# Patient Record
Sex: Male | Born: 1980
Health system: Southern US, Community
[De-identification: ages and names within clinical notes are randomized; demographics above are authoritative.]

## PROBLEM LIST (undated history)

## (undated) DIAGNOSIS — B20 Human immunodeficiency virus [HIV] disease: Secondary | ICD-10-CM

## (undated) DIAGNOSIS — Z21 Asymptomatic human immunodeficiency virus [HIV] infection status: Secondary | ICD-10-CM

## (undated) DIAGNOSIS — N189 Chronic kidney disease, unspecified: Secondary | ICD-10-CM

## (undated) DIAGNOSIS — R85612 Low grade squamous intraepithelial lesion on cytologic smear of anus (LGSIL): Secondary | ICD-10-CM

## (undated) DIAGNOSIS — E109 Type 1 diabetes mellitus without complications: Secondary | ICD-10-CM

## (undated) DIAGNOSIS — E113493 Type 2 diabetes mellitus with severe nonproliferative diabetic retinopathy without macular edema, bilateral: Secondary | ICD-10-CM

## (undated) DIAGNOSIS — D631 Anemia in chronic kidney disease: Secondary | ICD-10-CM

## (undated) DIAGNOSIS — E119 Type 2 diabetes mellitus without complications: Secondary | ICD-10-CM

## (undated) DIAGNOSIS — J189 Pneumonia, unspecified organism: Secondary | ICD-10-CM

## (undated) DIAGNOSIS — D849 Immunodeficiency, unspecified: Secondary | ICD-10-CM

## (undated) DIAGNOSIS — Z006 Encounter for examination for normal comparison and control in clinical research program: Secondary | ICD-10-CM

## (undated) DIAGNOSIS — B182 Chronic viral hepatitis C: Secondary | ICD-10-CM

## (undated) DIAGNOSIS — I10 Essential (primary) hypertension: Secondary | ICD-10-CM

## (undated) DIAGNOSIS — D649 Anemia, unspecified: Secondary | ICD-10-CM

## (undated) HISTORY — PX: WISDOM TOOTH EXTRACTION: SHX21

## (undated) HISTORY — PX: NO PAST SURGERIES: SHX2092

## (undated) HISTORY — DX: Asymptomatic human immunodeficiency virus (hiv) infection status: Z21

## (undated) HISTORY — DX: Chronic kidney disease, unspecified: N18.9

## (undated) HISTORY — DX: Anemia in chronic kidney disease: D63.1

## (undated) HISTORY — DX: Essential (primary) hypertension: I10

## (undated) HISTORY — DX: Anemia, unspecified: D64.9

## (undated) HISTORY — DX: Type 2 diabetes mellitus without complications: E11.9

## (undated) HISTORY — DX: Human immunodeficiency virus (HIV) disease: B20

---

## 2015-05-19 DIAGNOSIS — J189 Pneumonia, unspecified organism: Secondary | ICD-10-CM

## 2015-05-19 HISTORY — DX: Pneumonia, unspecified organism: J18.9

## 2016-03-12 ENCOUNTER — Telehealth: Payer: Self-pay

## 2016-03-12 NOTE — Telephone Encounter (Signed)
Medical records received. Patient scheduled for office visit. He may need medication refill prior to visit. He is insured and medication can be call to pharmacy for a 30 day supply.   Laverle Patter, RN

## 2016-03-24 ENCOUNTER — Other Ambulatory Visit: Payer: Self-pay

## 2016-03-24 DIAGNOSIS — B20 Human immunodeficiency virus [HIV] disease: Secondary | ICD-10-CM

## 2016-03-24 MED ORDER — ABACAVIR-DOLUTEGRAVIR-LAMIVUD 600-50-300 MG PO TABS: 1.0000 | ORAL_TABLET | Freq: Every day | ORAL | 0 refills | Status: DC

## 2016-03-24 NOTE — Telephone Encounter (Signed)
Patient is transferring care and will be out of medication soon.  30 day supply called to pharmacy, patient has appointment scheduled on 04-02-16 with Dr Linus Salmons.   Laverle Patter, RN

## 2016-03-27 ENCOUNTER — Telehealth: Payer: Self-pay

## 2016-03-27 ENCOUNTER — Other Ambulatory Visit: Payer: Self-pay

## 2016-03-27 DIAGNOSIS — B20 Human immunodeficiency virus [HIV] disease: Secondary | ICD-10-CM

## 2016-03-27 MED ORDER — ABACAVIR-DOLUTEGRAVIR-LAMIVUD 600-50-300 MG PO TABS: 1.0000 | ORAL_TABLET | Freq: Every day | ORAL | 0 refills | Status: DC

## 2016-03-27 NOTE — Telephone Encounter (Signed)
Please send medication to pharmacy.  Sent.

## 2016-03-27 NOTE — Telephone Encounter (Signed)
Patient called pharmacy. Medication was not there. I will resend meds.   Kassidy Frankson K Mathis Cashman,RN

## 2016-03-31 ENCOUNTER — Other Ambulatory Visit: Payer: Self-pay | Admitting: Pharmacist

## 2016-03-31 ENCOUNTER — Other Ambulatory Visit: Payer: Self-pay

## 2016-03-31 DIAGNOSIS — B20 Human immunodeficiency virus [HIV] disease: Secondary | ICD-10-CM

## 2016-03-31 MED ORDER — ABACAVIR-DOLUTEGRAVIR-LAMIVUD 600-50-300 MG PO TABS: 1.0000 | ORAL_TABLET | Freq: Every day | ORAL | 0 refills | Status: DC

## 2016-03-31 NOTE — Progress Notes (Signed)
Patient arrived as a walk in stating he is new to the area and is out of his Triumeq. He has an appointment with Dr. Linus Salmons on 11/16 but wanted to get his Triumeq refilled so that he did not miss any doses. [facility] so he needed them to sent to [facility] instead. I sent the prescription, called the pharmacy to confirm their receipt of the prescription and that the medication was in stock. Patient was informed of the plan and given a copay card. He will pick up his Triumeq today and follow up with Dr. Linus Salmons on Thursday.  Dimitri Ped, PharmD. PGY-2 Infectious Diseases Pharmacy Resident Pager: [phone] 03/31/2016, 3:33 PM

## 2016-04-02 ENCOUNTER — Ambulatory Visit (INDEPENDENT_AMBULATORY_CARE_PROVIDER_SITE_OTHER): Payer: Self-pay | Admitting: Internal Medicine

## 2016-04-02 ENCOUNTER — Encounter: Payer: Self-pay | Admitting: Internal Medicine

## 2016-04-02 DIAGNOSIS — Z113 Encounter for screening for infections with a predominantly sexual mode of transmission: Secondary | ICD-10-CM

## 2016-04-02 DIAGNOSIS — Z79899 Other long term (current) drug therapy: Secondary | ICD-10-CM

## 2016-04-02 DIAGNOSIS — B20 Human immunodeficiency virus [HIV] disease: Secondary | ICD-10-CM | POA: Insufficient documentation

## 2016-04-02 DIAGNOSIS — Z23 Encounter for immunization: Secondary | ICD-10-CM

## 2016-04-02 DIAGNOSIS — Z139 Encounter for screening, unspecified: Secondary | ICD-10-CM

## 2016-04-02 LAB — LIPID PANEL
Cholesterol: 272 mg/dL — ABNORMAL HIGH (ref <200)
HDL: 64 mg/dL (ref >40)
LDL Cholesterol: 184 mg/dL — ABNORMAL HIGH (ref <100)
Total CHOL/HDL Ratio: 4.3 Ratio (ref <5.0)
Triglycerides: 119 mg/dL (ref <150)
VLDL: 24 mg/dL (ref <30)

## 2016-04-02 LAB — COMPLETE METABOLIC PANEL WITH GFR
ALT: 10 U/L (ref 9–46)
AST: 15 U/L (ref 10–40)
Albumin: 3.7 g/dL (ref 3.6–5.1)
Alkaline Phosphatase: 62 U/L (ref 40–115)
BILIRUBIN TOTAL: 0.6 mg/dL (ref 0.2–1.2)
BUN: 13 mg/dL (ref 7–25)
CALCIUM: 9.3 mg/dL (ref 8.6–10.3)
CO2: 27 mmol/L (ref 20–31)
CREATININE: 1.93 mg/dL — AB (ref 0.60–1.35)
Chloride: 105 mmol/L (ref 98–110)
GFR, EST AFRICAN AMERICAN: 51 mL/min — AB (ref >=60)
GFR, Est Non African American: 44 mL/min — ABNORMAL LOW (ref >=60)
Glucose, Bld: 59 mg/dL — ABNORMAL LOW (ref 65–99)
Potassium: 3.8 mmol/L (ref 3.5–5.3)
Sodium: 139 mmol/L (ref 135–146)
TOTAL PROTEIN: 6.6 g/dL (ref 6.1–8.1)

## 2016-04-02 LAB — CBC WITH DIFFERENTIAL/PLATELET
BASOS PCT: 0 %
Basophils Absolute: 0 cells/uL (ref 0–200)
EOS ABS: 116 {cells}/uL (ref 15–500)
Eosinophils Relative: 2 %
HCT: 35.7 % — ABNORMAL LOW (ref 38.5–50.0)
Hemoglobin: 12.1 g/dL — ABNORMAL LOW (ref 13.2–17.1)
LYMPHS PCT: 64 %
Lymphs Abs: 3712 cells/uL (ref 850–3900)
MCH: 32.4 pg (ref 27.0–33.0)
MCHC: 33.9 g/dL (ref 32.0–36.0)
MCV: 95.7 fL (ref 80.0–100.0)
MONOS PCT: 6 %
MPV: 9 fL (ref 7.5–12.5)
Monocytes Absolute: 348 cells/uL (ref 200–950)
Neutro Abs: 1624 cells/uL (ref 1500–7800)
Neutrophils Relative %: 28 %
PLATELETS: 330 10*3/uL (ref 140–400)
RBC: 3.73 MIL/uL — ABNORMAL LOW (ref 4.20–5.80)
RDW: 13.9 % (ref 11.0–15.0)
WBC: 5.8 10*3/uL (ref 3.8–10.8)

## 2016-04-02 MED ORDER — ABACAVIR-DOLUTEGRAVIR-LAMIVUD 600-50-300 MG PO TABS: 1.0000 | ORAL_TABLET | Freq: Every day | ORAL | 5 refills | Status: DC

## 2016-04-02 NOTE — Progress Notes (Signed)
Triumeq prescription sent to [facility]. Discussed importance of adherence and to call clinic before starting any new medications or over the counter products. Patient expressed understanding.  Dimitri Ped, PharmD. PGY-2 Infectious Diseases Pharmacy Resident Pager: [phone] 04/02/2016, 4:00 PM

## 2016-04-02 NOTE — Progress Notes (Signed)
Patient ID: Derel Mcglasson, male   DOB: 1980/11/11, 35 y.o.   MRN: [id number]  Patient ID: Euriah Matlack, male    DOB: 1980-05-29, 35 y.o.   MRN: [id number]  Reason for visit: to establish care as a new patient with HIV  HPI:   Patient was first diagnosed in 31 in [place] and started then on Atripla.  Last year he was changed to Triumeq.  He continues on medication and pleased with it, though concerned about costs.  He was tested as part routine screening.   The CD4 count is wnl, viral load was undetectable last check in [place].  There have been no associated symptoms.   He denies any history of OIs, no history of gc or chlamydia.  Did have syphilis in the past.   PMHx: diabetes, hypertension  Prior to Admission medications   Medication Sig Start Date End Date Taking? Authorizing Provider  abacavir-dolutegravir-lamiVUDine (TRIUMEQ) 600-50-300 MG tablet Take 1 tablet by mouth daily. 04/02/16  Yes Thayer Headings, MD  insulin aspart protamine- aspart (NOVOLOG MIX 70/30) (70-30) 100 UNIT/ML injection Inject 10 Units into the skin 2 (two) times daily with a meal.   Yes Historical Provider, MD  lisinopril (PRINIVIL,ZESTRIL) 40 MG tablet Take 40 mg by mouth daily.   Yes Historical Provider, MD    No Known Allergies  Social History  Substance Use Topics  . Smoking status: Never Smoker  . Smokeless tobacco: Never Used  . Alcohol use Yes     Comment: socially    FMHx: no renal disease  Review of Systems Constitutional: negative for fevers, chills and malaise Respiratory: negative for cough or wheezing Musculoskeletal: negative for myalgias and arthralgias All other systems reviewed and are negative    CONSTITUTIONAL:in no apparent distress and alert  Vitals:   04/02/16 1354  BP: (!) 158/98  Pulse: 88  Temp: 98.1 F (36.7 C)   EYES: anicteric HENT: no thrush CARD:Cor RRR and No murmurs RESP:CTA B; normal respiratory effort BM:SXJDB sounds are normal, liver is not enlarged, spleen is  not enlarged MS:no pedal edema noted SKIN: no rashes NEURO: non-focal  Assessment: new patient here with established HIV.  Discussed with patient treatment options and side effects, benefits of treatment, long term outcomes with his current treatment.  I discussed the severity of untreated HIV including higher cancer risk, opportunistic infections, renal failure.  Also discussed needing to use condoms, partner disclosure, necessary vaccines, blood monitoring.  All questions answered.    Plan: 1) continue triumeq.  This was discussed with pharmacy to help assure coverage.  2) labs today 3) sign dental form rtc 3 months

## 2016-04-03 ENCOUNTER — Telehealth: Payer: Self-pay | Admitting: *Deleted

## 2016-04-03 LAB — RPR

## 2016-04-03 LAB — URINE CYTOLOGY ANCILLARY ONLY
Chlamydia: NEGATIVE
Neisseria Gonorrhea: NEGATIVE

## 2016-04-03 LAB — URINALYSIS, ROUTINE W REFLEX MICROSCOPIC
Bilirubin Urine: NEGATIVE
Ketones, ur: NEGATIVE
Leukocytes, UA: NEGATIVE
NITRITE: NEGATIVE
PH: 6 (ref 5.0–8.0)
SPECIFIC GRAVITY, URINE: 1.02 (ref 1.001–1.035)

## 2016-04-03 LAB — URINALYSIS, MICROSCOPIC ONLY
BACTERIA UA: NONE SEEN [HPF]
CASTS: NONE SEEN [LPF]
CRYSTALS: NONE SEEN [HPF]
Yeast: NONE SEEN [HPF]

## 2016-04-03 LAB — T-HELPER CELL (CD4) - (RCID CLINIC ONLY)
CD4 % Helper T Cell: 39 % (ref 33–55)
CD4 T Cell Abs: 1490 /uL (ref 400–2700)

## 2016-04-03 NOTE — Telephone Encounter (Signed)
Called patient and left a voice mail to call back for his new PCP information. He is scheduled to see Dorothea Ogle at [facility] on 04/08/16 at 2:45 pm. They will address his diabetes and hypertension at that time. If he needs to reschedule the phone # is [phone]. Address is [place].

## 2016-04-06 LAB — HIV-1 RNA ULTRAQUANT REFLEX TO GENTYP+
HIV 1 RNA QUANT: 22 {copies}/mL — AB (ref <20)
HIV-1 RNA QUANT, LOG: 1.34 {Log_copies}/mL — AB (ref <1.30)

## 2016-04-06 NOTE — Telephone Encounter (Signed)
Attempted to call patient today and voice mail is full. Called and left a detailed message with this appt info on his emergency contact voice mail (listed as significant other, Corene Cornea) and advised if he can not keep this appt to call their office to reschedule. Myrtis Hopping

## 2016-04-07 LAB — HLA B*5701: HLA-B*5701 w/rflx HLA-B High: NEGATIVE

## 2016-04-08 ENCOUNTER — Ambulatory Visit: Payer: Self-pay | Admitting: Medical

## 2016-04-21 ENCOUNTER — Encounter: Payer: Self-pay | Admitting: *Deleted

## 2016-04-22 ENCOUNTER — Other Ambulatory Visit: Payer: Self-pay | Admitting: Pharmacist

## 2016-04-22 ENCOUNTER — Ambulatory Visit: Payer: Self-pay | Admitting: Medical

## 2016-04-22 DIAGNOSIS — B20 Human immunodeficiency virus [HIV] disease: Secondary | ICD-10-CM

## 2016-04-22 MED ORDER — ABACAVIR-DOLUTEGRAVIR-LAMIVUD 600-50-300 MG PO TABS: 1.0000 | ORAL_TABLET | Freq: Every day | ORAL | 5 refills | Status: DC

## 2016-05-27 ENCOUNTER — Ambulatory Visit (INDEPENDENT_AMBULATORY_CARE_PROVIDER_SITE_OTHER): Payer: BLUE CROSS/BLUE SHIELD | Admitting: Medical

## 2016-05-27 ENCOUNTER — Encounter: Payer: Self-pay | Admitting: Medical

## 2016-05-27 VITALS — BP 128/74 | HR 81 | Ht 71.0 in | Wt 177.2 lb

## 2016-05-27 DIAGNOSIS — Z7185 Encounter for immunization safety counseling: Secondary | ICD-10-CM

## 2016-05-27 DIAGNOSIS — E119 Type 2 diabetes mellitus without complications: Secondary | ICD-10-CM | POA: Diagnosis not present

## 2016-05-27 DIAGNOSIS — Z23 Encounter for immunization: Secondary | ICD-10-CM

## 2016-05-27 DIAGNOSIS — E785 Hyperlipidemia, unspecified: Secondary | ICD-10-CM

## 2016-05-27 DIAGNOSIS — I10 Essential (primary) hypertension: Secondary | ICD-10-CM | POA: Diagnosis not present

## 2016-05-27 DIAGNOSIS — R799 Abnormal finding of blood chemistry, unspecified: Secondary | ICD-10-CM | POA: Diagnosis not present

## 2016-05-27 DIAGNOSIS — D649 Anemia, unspecified: Secondary | ICD-10-CM | POA: Diagnosis not present

## 2016-05-27 DIAGNOSIS — B20 Human immunodeficiency virus [HIV] disease: Secondary | ICD-10-CM | POA: Diagnosis not present

## 2016-05-27 DIAGNOSIS — Z139 Encounter for screening, unspecified: Secondary | ICD-10-CM

## 2016-05-27 DIAGNOSIS — Z7189 Other specified counseling: Secondary | ICD-10-CM | POA: Diagnosis not present

## 2016-05-27 DIAGNOSIS — R7989 Other specified abnormal findings of blood chemistry: Secondary | ICD-10-CM | POA: Insufficient documentation

## 2016-05-27 LAB — BASIC METABOLIC PANEL
BUN: 21 mg/dL (ref 7–25)
CALCIUM: 9.4 mg/dL (ref 8.6–10.3)
CHLORIDE: 104 mmol/L (ref 98–110)
CO2: 28 mmol/L (ref 20–31)
Creat: 2.17 mg/dL — ABNORMAL HIGH (ref 0.60–1.35)
GLUCOSE: 91 mg/dL (ref 65–99)
Potassium: 5.3 mmol/L (ref 3.5–5.3)
SODIUM: 137 mmol/L (ref 135–146)

## 2016-05-27 LAB — POCT URINALYSIS DIPSTICK
Bilirubin, UA: NEGATIVE
KETONES UA: NEGATIVE
Leukocytes, UA: NEGATIVE
Nitrite, UA: NEGATIVE
UROBILINOGEN UA: NEGATIVE
pH, UA: 6

## 2016-05-27 LAB — HEMOGLOBIN A1C
HEMOGLOBIN A1C: 11.5 % — AB (ref <5.7)
Mean Plasma Glucose: 283 mg/dL

## 2016-05-27 LAB — HEPATITIS B SURFACE ANTIGEN: Hepatitis B Surface Ag: NEGATIVE

## 2016-05-27 LAB — HEPATITIS B CORE ANTIBODY, IGM: Hep B C IgM: NONREACTIVE

## 2016-05-27 LAB — HEPATITIS B SURFACE ANTIBODY, QUANTITATIVE: Hepatitis B-Post: 22.6 m[IU]/mL

## 2016-05-27 MED ORDER — PRAVASTATIN SODIUM 20 MG PO TABS: 20.0000 mg | ORAL_TABLET | Freq: Every day | ORAL | 0 refills | Status: DC

## 2016-05-27 NOTE — Progress Notes (Signed)
Subjective: Chief Complaint  Patient presents with  . new patient    new pt, discuss htn, and his dm , lower back pain    Was seeing doctor in [place] prior, but wanted something closer to work in [place].  Works at [employer].  Has some family in [place].   Medical team: Dr. Scharlene Gloss, infectious disease, just established recently hasnt' been seeing endocrionlogy in a few years, just been using OTC [facility] [facility] Prior PCP was in [place]  Diagnosed with HIV 03/2013.    Has hx/o hypertension - diagnosed 2017, has been titrated up to current dose of Lisipnriol 40mg   Diagnosed with type 1 diabetes in 1997.  Been seeing glucose readings in the 200s, worse in Christmas Holidays.  Highest recent reading 289.  Lowest readings around 71.  Checks sugars on average BID.   Using 12 units BID with Novolog 70/30.  Mornings typically sees 200.   evenigns glucose typicaly runs high 100s.  soemtimes checks lunch, feels it can be low, sometimes 70-80s.     No hx/o renal problems or anemia  Was on statin in the past for high cholesterol  Not sure about last Td or pneumonia vaccines.    No past medical history on file.   Current Outpatient Prescriptions on File Prior to Visit  Medication Sig Dispense Refill  . abacavir-dolutegravir-lamiVUDine (TRIUMEQ) 600-50-300 MG tablet Take 1 tablet by mouth daily. 30 tablet 5  . insulin aspart protamine- aspart (NOVOLOG MIX 70/30) (70-30) 100 UNIT/ML injection Inject 10 Units into the skin 2 (two) times daily with a meal.    . lisinopril (PRINIVIL,ZESTRIL) 40 MG tablet Take 40 mg by mouth daily.     No current facility-administered medications on file prior to visit.    ROS as subjective    Objective: BP 128/74   Pulse 81   Ht 5\' 11"  (1.803 m)   Wt 177 lb 3.2 oz (80.4 kg)   SpO2 99%   BMI 24.71 kg/m   Wt Readings from Last 3 Encounters:  05/27/16 177 lb 3.2 oz (80.4 kg)  04/02/16 172 lb (78 kg)   BP  Readings from Last 3 Encounters:  05/27/16 128/74  04/02/16 (!) 158/98      General appearence: alert, no distress, WD/WN, lean AA male Neck: supple, no lymphadenopathy, no thyromegaly, no masses Heart: RRR, normal S1, S2, no murmurs Lungs: CTA bilaterally, no wheezes, rhonchi, or rales Extremities: no edema, no cyanosis, no clubbing Pulses: 2+ symmetric, upper and lower extremities, normal cap refill Neurological: alert, oriented x 3, CN2-12 intact, strength normal upper extremities and lower extremities, sensation normal throughout, DTRs 2+ throughout, no cerebellar signs, gait normal Psychiatric: normal affect, behavior normal, pleasant   Diabetic Foot Exam - Simple   Simple Foot Form Diabetic Foot exam was performed with the following findings:  Yes 05/27/2016 12:03 PM  Visual Inspection See comments:  Yes Sensation Testing Intact to touch and monofilament testing bilaterally:  Yes Pulse Check Posterior Tibialis and Dorsalis pulse intact bilaterally:  Yes Comments Mild left ankle puffiness, otherwise no deformity or swelling       Assessment: Encounter Diagnoses  Name Primary?  . Diabetes mellitus without complication ([place]) Yes  . Abnormal blood creatinine level   . Anemia, unspecified type   . HIV disease ([place])   . Vaccine counseling   . Essential hypertension   . Hyperlipidemia, unspecified hyperlipidemia type   . Screening for condition      Plan: Discussed  his medical history, prior care, and gave the following recommendations  Recommendations:  Continue routine f/u with infectious disease  We are referring you to an endocrinologist to help manage your type 1 diabetes  For now continue the morning insulin 12 units, and increase the night time insulin dose to 13 units.   Eat a healthy strict diabetic diet  Get exercise regularly  Check your feet daily for wounds/sores  Establish with dentist, eye doctor  Check sugars before meals, at bedtime, log  these on paper or on smart phone app and bring your numbers in next visit with [place] or endocrinology  Begin Pravachol at bedtime to lower cholesterol and heart disease risk  I recommend you check with insurer about whether they cover the following vaccines we can do here: Tdap, Hepatitis B and Pneumococcal vaccine  We will call back about the kidney marker  continue rest of medications as usual    hyperlipidema - lipid panel from 04/02/16 reviewed. Abnroaml creatinine - Cr 1.93 on 04/02/16, BUN 13.    Deno was seen today for new patient.  Diagnoses and all orders for this visit:  Diabetes mellitus without complication (HCC) -     Urinalysis Dipstick -     Basic metabolic panel -     Microalbumin / creatinine urine ratio -     Hemoglobin A1c -     Hepatitis B surface antigen -     Hepatitis B core antibody, IgM -     Hepatitis B surface antibody -     HM DIABETES FOOT EXAM  Abnormal blood creatinine level -     Basic metabolic panel -     Microalbumin / creatinine urine ratio -     Hemoglobin A1c -     Hepatitis B surface antigen -     Hepatitis B core antibody, IgM -     Hepatitis B surface antibody  Anemia, unspecified type -     Basic metabolic panel -     Microalbumin / creatinine urine ratio -     Hemoglobin A1c -     Hepatitis B surface antigen -     Hepatitis B core antibody, IgM -     Hepatitis B surface antibody  HIV disease (HCC) -     Hepatitis B surface antigen -     Hepatitis B core antibody, IgM -     Hepatitis B surface antibody  Vaccine counseling -     Hepatitis B surface antigen -     Hepatitis B core antibody, IgM -     Hepatitis B surface antibody  Essential hypertension -     Hepatitis B surface antigen -     Hepatitis B core antibody, IgM -     Hepatitis B surface antibody  Hyperlipidemia, unspecified hyperlipidemia type -     Hepatitis B surface antigen -     Hepatitis B core antibody, IgM -     Hepatitis B surface  antibody  Screening for condition -     Hepatitis B surface antigen -     Hepatitis B core antibody, IgM -     Hepatitis B surface antibody  Other orders -     pravastatin (PRAVACHOL) 20 MG tablet; Take 1 tablet (20 mg total) by mouth daily.

## 2016-05-27 NOTE — Addendum Note (Signed)
Addended by: Tyrone Apple on: 05/27/2016 12:19 PM   Modules accepted: Orders

## 2016-05-27 NOTE — Patient Instructions (Addendum)
Recommendations:  Continue routine f/u with infectious disease  We are referring you to an endocrinologist to help manage your type 1 diabetes  For now continue the morning insulin 12 units, and increase the night time insulin dose to 13 units.   Eat a healthy strict diabetic diet  Get exercise regularly  Check your feet daily for wounds/sores  Establish with dentist, eye doctor  Check sugars before meals, at bedtime, log these on paper or on smart phone app and bring your numbers in next visit with [place] or endocrinology  Begin Pravachol at bedtime to lower cholesterol and heart disease risk  I recommend you check with insurer about whether they cover the following vaccines we can do here: Tdap, Hepatitis B and Pneumococcal vaccine  We will call back about the kidney marker  continue rest of medications as usual     Dentist: Dr. Jonna Coup, dentist [address], [place], [place] [address] [phone] [email or web address]   Eye doctor: [facility] Dr. Camillo Flaming [address] [place], [place] [address]  [email or web address]   Fabio Pierce, M.D. Corena Herter, O.D. [place], [place], [place] [address] Medical telephone: [phone] Optical telephone: [phone]

## 2016-05-28 ENCOUNTER — Other Ambulatory Visit: Payer: Self-pay | Admitting: Medical

## 2016-05-28 DIAGNOSIS — E109 Type 1 diabetes mellitus without complications: Secondary | ICD-10-CM | POA: Insufficient documentation

## 2016-05-28 DIAGNOSIS — R809 Proteinuria, unspecified: Secondary | ICD-10-CM

## 2016-05-28 DIAGNOSIS — E1029 Type 1 diabetes mellitus with other diabetic kidney complication: Secondary | ICD-10-CM

## 2016-05-28 DIAGNOSIS — I10 Essential (primary) hypertension: Secondary | ICD-10-CM

## 2016-05-28 LAB — MICROALBUMIN / CREATININE URINE RATIO
Creatinine, Urine: 137 mg/dL (ref 20–370)
MICROALB/CREAT RATIO: 539 ug/mg{creat} — AB (ref <30)
Microalb, Ur: 73.9 mg/dL

## 2016-05-28 MED ORDER — LISINOPRIL-HYDROCHLOROTHIAZIDE 20-12.5 MG PO TABS: 1.0000 | ORAL_TABLET | Freq: Every day | ORAL | 0 refills | Status: DC

## 2016-05-28 MED ORDER — "NEEDLE (DISP) 30G X 1/2"" MISC": 1.0000 | Freq: Three times a day (TID) | 3 refills | Status: DC

## 2016-05-28 MED ORDER — INSULIN ASPART 100 UNIT/ML FLEXPEN: 10.0000 [IU] | PEN_INJECTOR | Freq: Three times a day (TID) | SUBCUTANEOUS | 3 refills | Status: DC

## 2016-06-09 ENCOUNTER — Encounter: Payer: Self-pay | Admitting: Internal Medicine

## 2016-07-09 ENCOUNTER — Ambulatory Visit: Payer: Self-pay | Admitting: Internal Medicine

## 2016-07-14 ENCOUNTER — Ambulatory Visit: Payer: Self-pay | Admitting: Endocrinology

## 2016-08-05 ENCOUNTER — Ambulatory Visit (INDEPENDENT_AMBULATORY_CARE_PROVIDER_SITE_OTHER): Payer: 59 | Admitting: Internal Medicine

## 2016-08-05 ENCOUNTER — Encounter: Payer: Self-pay | Admitting: Endocrinology

## 2016-08-05 ENCOUNTER — Ambulatory Visit (INDEPENDENT_AMBULATORY_CARE_PROVIDER_SITE_OTHER): Payer: 59 | Admitting: Endocrinology

## 2016-08-05 ENCOUNTER — Encounter: Payer: Self-pay | Admitting: Internal Medicine

## 2016-08-05 VITALS — BP 158/102 | HR 86 | Temp 98.1°F | Wt 175.0 lb

## 2016-08-05 VITALS — BP 150/94 | HR 89 | Ht 71.0 in | Wt 174.0 lb

## 2016-08-05 DIAGNOSIS — E1022 Type 1 diabetes mellitus with diabetic chronic kidney disease: Secondary | ICD-10-CM

## 2016-08-05 DIAGNOSIS — E119 Type 2 diabetes mellitus without complications: Secondary | ICD-10-CM | POA: Diagnosis not present

## 2016-08-05 DIAGNOSIS — N183 Chronic kidney disease, stage 3 unspecified: Secondary | ICD-10-CM

## 2016-08-05 DIAGNOSIS — Z7185 Encounter for immunization safety counseling: Secondary | ICD-10-CM

## 2016-08-05 DIAGNOSIS — B20 Human immunodeficiency virus [HIV] disease: Secondary | ICD-10-CM | POA: Diagnosis not present

## 2016-08-05 DIAGNOSIS — Z23 Encounter for immunization: Secondary | ICD-10-CM

## 2016-08-05 DIAGNOSIS — Z7189 Other specified counseling: Secondary | ICD-10-CM

## 2016-08-05 LAB — POCT GLYCOSYLATED HEMOGLOBIN (HGB A1C): Hemoglobin A1C: 10

## 2016-08-05 MED ORDER — BASAGLAR KWIKPEN 100 UNIT/ML ~~LOC~~ SOPN: 35.0000 [IU] | PEN_INJECTOR | SUBCUTANEOUS | 11 refills | Status: DC

## 2016-08-05 NOTE — Progress Notes (Signed)
Subjective:    Patient ID: Chase Rollins, male    DOB: [birth year], 36 y.o.   MRN: [id number]  HPI pt is referred by Chana Bode, PA,  for diabetes.  Pt states DM was dx'ed in 1997; he has mild neuropathy of the lower extremities; he has associated renal failure; he has been on insulin since soon after dx; pt says his diet and exercise are improved; he has never had pancreatitis, pancreatic surgery, severe hypoglycemia or DKA.  He works at a bank. He takes 70/30, 12 units 3 times a day (just before each meal).  He says cbg's are widely variable.  He has frequent mild hypoglycemia at lunch.  He says he occasionally misses the insulin.  Past Medical History:  Diagnosis Date  . Diabetes ([place])   . HIV (human immunodeficiency virus infection) ([place])   . HTN (hypertension)     No past surgical history on file.  Social History   Social History  . Marital status: Single    Spouse name: N/A  . Number of children: N/A  . Years of education: N/A   Occupational History  . Not on file.   Social History Main Topics  . Smoking status: Never Smoker  . Smokeless tobacco: Never Used  . Alcohol use Yes     Comment: socially  . Drug use: No  . Sexual activity: Yes    Partners: Male     Comment: pt declined condoms   Other Topics Concern  . Not on file   Social History Narrative  . No narrative on file    Current Outpatient Prescriptions on File Prior to Visit  Medication Sig Dispense Refill  . abacavir-dolutegravir-lamiVUDine (TRIUMEQ) 600-50-300 MG tablet Take 1 tablet by mouth daily. 30 tablet 5  . lisinopril-hydrochlorothiazide (ZESTORETIC) 20-12.5 MG tablet Take 1 tablet by mouth daily. 90 tablet 0  . pravastatin (PRAVACHOL) 20 MG tablet Take 1 tablet (20 mg total) by mouth daily. 90 tablet 0  . Insulin Glargine (BASAGLAR KWIKPEN) 100 UNIT/ML SOPN Inject 0.35 Units into the skin every morning.    . insulin NPH-regular Human (NOVOLIN 70/30) (70-30) 100 UNIT/ML injection Inject  into the skin.     No current facility-administered medications on file prior to visit.     No Known Allergies  Family History  Problem Relation Age of Onset  . Diabetes Mother   . Diabetes Paternal Grandmother     BP (!) 150/94   Pulse 89   Ht 5\' 11"  (1.803 m)   Wt 174 lb (78.9 kg)   SpO2 98%   BMI 24.27 kg/m     Review of Systems denies weight loss, blurry vision, headache, chest pain, sob, n/v, urinary frequency, muscle cramps, excessive diaphoresis, depression, cold intolerance, rhinorrhea, and easy bruising.     Objective:   Physical Exam VS: see vs page GEN: no distress HEAD: head: no deformity eyes: no periorbital swelling, no proptosis external nose and ears are normal mouth: no lesion seen NECK: supple, thyroid is not enlarged.  CHEST WALL: no deformity LUNGS: clear to auscultation CV: reg rate and rhythm, no murmur ABD: abdomen is soft, nontender.  no hepatosplenomegaly.  not distended.  no hernia MUSCULOSKELETAL: muscle bulk and strength are grossly normal.  no obvious joint swelling.  gait is normal and steady EXTEMITIES: no deformity.  no ulcer on the feet.  feet are of normal color and temp.  no edema.  PULSES: dorsalis pedis intact bilat.  no carotid bruit NEURO:  cn  2-12 grossly intact.   readily moves all 4's.  sensation is intact to touch on the feet.  SKIN:  Normal texture and temperature.  No rash or suspicious lesion is visible.   NODES:  None palpable at the neck.  PSYCH: alert, well-oriented.  Does not appear anxious nor depressed.   a1c=10.0% Lab Results  Component Value Date   CREATININE 2.17 (H) 05/27/2016   BUN 21 05/27/2016   NA 137 05/27/2016   K 5.3 05/27/2016   CL 104 05/27/2016   CO2 28 05/27/2016   I have reviewed outside records, and summarized: Pt was noted to have elevated a1c, and referred here.  He is also receiving rx for HIV.  Med probs were stable, except for DM.      Assessment & Plan:  Type 1 DM, new to me:  severe exacerbation.  Noncompliance with insulin: he needs a simpler regimen.  Renal failure: I ref nephrol, at pt's request.  HTN: not well-controlled.  Recheck at nephrol appt.   Patient is advised the following: Patient Instructions  good diet and exercise significantly improve the control of your diabetes.  please let me know if you wish to be referred to a dietician.  high blood sugar is very risky to your health.  you should see an eye doctor and dentist every year.  It is very important to get all recommended vaccinations.  Controlling your blood pressure and cholesterol drastically reduces the damage diabetes does to your body.  Those who smoke should quit.  Please discuss these with your doctor.  check your blood sugar twice a day.  vary the time of day when you check, between before the 3 meals, and at bedtime.  also check if you have symptoms of your blood sugar being too high or too low.  please keep a record of the readings and bring it to your next appointment here (or you can bring the meter itself).  You can write it on any piece of paper.  please call us sooner if your blood sugar goes below 70, or if you have a lot of readings over 200.   Please change your current insulin to basaglr, 35 units each morning. On this type of insulin schedule, you should eat meals on a regular schedule.  If a meal is missed or significantly delayed, your blood sugar could go low. We will need to take this complex situation in stages Please call us next week, to tell us how the blood sugar is doing.  Please come back for a follow-up appointment in 1 month.

## 2016-08-05 NOTE — Patient Instructions (Addendum)
good diet and exercise significantly improve the control of your diabetes.  please let me know if you wish to be referred to a dietician.  high blood sugar is very risky to your health.  you should see an eye doctor and dentist every year.  It is very important to get all recommended vaccinations.  Controlling your blood pressure and cholesterol drastically reduces the damage diabetes does to your body.  Those who smoke should quit.  Please discuss these with your doctor.  check your blood sugar twice a day.  vary the time of day when you check, between before the 3 meals, and at bedtime.  also check if you have symptoms of your blood sugar being too high or too low.  please keep a record of the readings and bring it to your next appointment here (or you can bring the meter itself).  You can write it on any piece of paper.  please call us sooner if your blood sugar goes below 70, or if you have a lot of readings over 200.   Please change your current insulin to basaglar, 35 units each morning. On this type of insulin schedule, you should eat meals on a regular schedule.  If a meal is missed or significantly delayed, your blood sugar could go low. We will need to take this complex situation in stages Please call us next week, to tell us how the blood sugar is doing.  Please come back for a follow-up appointment in 1 month.

## 2016-08-06 NOTE — Assessment & Plan Note (Signed)
Doing well.  Labs today and rtc 4 months unless concerns

## 2016-08-06 NOTE — Progress Notes (Signed)
CC: Follow up for HIV  Interval history: Currently is asymptomatic and well-controlled on Triumeq.  No issues since last visit.  Has no associated n/v/d.  Denies any missed doses.      Prior to Admission medications   Medication Sig Start Date End Date Taking? Authorizing Provider  abacavir-dolutegravir-lamiVUDine (TRIUMEQ) 600-50-300 MG tablet Take 1 tablet by mouth daily. 04/22/16  Yes Thayer Headings, MD  Insulin Glargine (BASAGLAR KWIKPEN) 100 UNIT/ML SOPN Inject 0.35 Units into the skin every morning.   Yes Historical Provider, MD  insulin NPH-regular Human (NOVOLIN 70/30) (70-30) 100 UNIT/ML injection Inject into the skin.   Yes Historical Provider, MD  lisinopril-hydrochlorothiazide (ZESTORETIC) 20-12.5 MG tablet Take 1 tablet by mouth daily. 05/28/16  Yes Camelia Eng Tysinger, PA-C  pravastatin (PRAVACHOL) 20 MG tablet Take 1 tablet (20 mg total) by mouth daily. 05/27/16  Yes Carlena Hurl, PA-C    Review of Systems Constitutional: negative for malaise Gastrointestinal: negative for nausea Integument/breast: negative for rash All other systems reviewed and are negative    Physical Exam: CONSTITUTIONAL:in no apparent distress  Vitals:   08/05/16 1552  BP: (!) 158/102  Pulse: 86  Temp: 98.1 F (36.7 C)   Eyes: anicteric HENT: no thrush, no cervical lymphadenopathy Respiratory: Normal respiratory effort; CTA B  Lab Results  Component Value Date   HIV1RNAQUANT 22 (H) 04/02/2016   No components found for: HIV1GENOTYPRPLUS No components found for: THELPERCELL

## 2016-08-06 NOTE — Assessment & Plan Note (Signed)
Will do menveo today

## 2016-08-07 ENCOUNTER — Encounter: Payer: Self-pay | Admitting: Endocrinology

## 2016-08-07 LAB — HIV-1 RNA QUANT-NO REFLEX-BLD
HIV 1 RNA QUANT: NOT DETECTED {copies}/mL
HIV-1 RNA QUANT, LOG: NOT DETECTED {Log_copies}/mL

## 2016-08-07 LAB — T-HELPER CELL (CD4) - (RCID CLINIC ONLY)
CD4 % Helper T Cell: 47 % (ref 33–55)
CD4 T Cell Abs: 1250 /uL (ref 400–2700)

## 2016-08-27 ENCOUNTER — Other Ambulatory Visit: Payer: Self-pay | Admitting: Medical

## 2016-08-27 DIAGNOSIS — I10 Essential (primary) hypertension: Secondary | ICD-10-CM

## 2016-09-04 ENCOUNTER — Ambulatory Visit: Payer: 59 | Admitting: Endocrinology

## 2016-09-07 ENCOUNTER — Other Ambulatory Visit: Payer: Self-pay | Admitting: *Deleted

## 2016-09-07 DIAGNOSIS — B20 Human immunodeficiency virus [HIV] disease: Secondary | ICD-10-CM

## 2016-09-07 MED ORDER — ABACAVIR-DOLUTEGRAVIR-LAMIVUD 600-50-300 MG PO TABS: 1.0000 | ORAL_TABLET | Freq: Every day | ORAL | 5 refills | Status: DC

## 2016-09-10 ENCOUNTER — Other Ambulatory Visit: Payer: Self-pay | Admitting: Pharmacist Clinician (PhC)/ Clinical Pharmacy Specialist

## 2016-09-10 ENCOUNTER — Telehealth: Payer: Self-pay | Admitting: *Deleted

## 2016-09-10 ENCOUNTER — Other Ambulatory Visit: Payer: Self-pay | Admitting: *Deleted

## 2016-09-10 DIAGNOSIS — B20 Human immunodeficiency virus [HIV] disease: Secondary | ICD-10-CM

## 2016-09-10 MED ORDER — ABACAVIR-DOLUTEGRAVIR-LAMIVUD 600-50-300 MG PO TABS: 1.0000 | ORAL_TABLET | Freq: Every day | ORAL | 5 refills | Status: DC

## 2016-09-10 NOTE — Telephone Encounter (Signed)
Error

## 2016-09-10 NOTE — Telephone Encounter (Signed)
Patient called stating [facility] [phone]. Called into correct pharmacy and provided patient all the information for the copay card. He is unable to come in to the office to pick it up.

## 2016-09-15 DIAGNOSIS — N183 Chronic kidney disease, stage 3 (moderate): Secondary | ICD-10-CM | POA: Diagnosis not present

## 2016-09-15 DIAGNOSIS — I129 Hypertensive chronic kidney disease with stage 1 through stage 4 chronic kidney disease, or unspecified chronic kidney disease: Secondary | ICD-10-CM | POA: Diagnosis not present

## 2016-09-15 DIAGNOSIS — D631 Anemia in chronic kidney disease: Secondary | ICD-10-CM | POA: Diagnosis not present

## 2016-09-18 ENCOUNTER — Other Ambulatory Visit: Payer: Self-pay | Admitting: Nephrology

## 2016-09-18 DIAGNOSIS — N183 Chronic kidney disease, stage 3 unspecified: Secondary | ICD-10-CM

## 2016-09-28 ENCOUNTER — Other Ambulatory Visit: Payer: 59

## 2016-09-28 DIAGNOSIS — N183 Chronic kidney disease, stage 3 (moderate): Secondary | ICD-10-CM | POA: Diagnosis not present

## 2016-10-02 ENCOUNTER — Ambulatory Visit
Admission: RE | Admit: 2016-10-02 | Discharge: 2016-10-02 | Disposition: A | Discharge status: Home / Self Care | Payer: 59 | Source: Ambulatory Visit | Attending: Nephrology | Admitting: Nephrology

## 2016-10-02 DIAGNOSIS — N183 Chronic kidney disease, stage 3 unspecified: Secondary | ICD-10-CM

## 2016-10-08 ENCOUNTER — Other Ambulatory Visit: Payer: Self-pay

## 2016-10-08 MED ORDER — BASAGLAR KWIKPEN 100 UNIT/ML ~~LOC~~ SOPN: 0.3500 [IU] | PEN_INJECTOR | SUBCUTANEOUS | 2 refills | Status: DC

## 2016-10-28 DIAGNOSIS — E1129 Type 2 diabetes mellitus with other diabetic kidney complication: Secondary | ICD-10-CM | POA: Diagnosis not present

## 2016-10-28 DIAGNOSIS — I129 Hypertensive chronic kidney disease with stage 1 through stage 4 chronic kidney disease, or unspecified chronic kidney disease: Secondary | ICD-10-CM | POA: Diagnosis not present

## 2016-10-28 DIAGNOSIS — N183 Chronic kidney disease, stage 3 (moderate): Secondary | ICD-10-CM | POA: Diagnosis not present

## 2016-11-12 DIAGNOSIS — Z7689 Persons encountering health services in other specified circumstances: Secondary | ICD-10-CM | POA: Diagnosis not present

## 2016-11-20 DIAGNOSIS — E113491 Type 2 diabetes mellitus with severe nonproliferative diabetic retinopathy without macular edema, right eye: Secondary | ICD-10-CM | POA: Diagnosis not present

## 2016-11-20 DIAGNOSIS — E113412 Type 2 diabetes mellitus with severe nonproliferative diabetic retinopathy with macular edema, left eye: Secondary | ICD-10-CM | POA: Diagnosis not present

## 2016-11-20 DIAGNOSIS — H3561 Retinal hemorrhage, right eye: Secondary | ICD-10-CM | POA: Diagnosis not present

## 2016-12-14 DIAGNOSIS — E113412 Type 2 diabetes mellitus with severe nonproliferative diabetic retinopathy with macular edema, left eye: Secondary | ICD-10-CM | POA: Diagnosis not present

## 2016-12-16 ENCOUNTER — Ambulatory Visit: Payer: 59 | Admitting: Internal Medicine

## 2016-12-18 DIAGNOSIS — I129 Hypertensive chronic kidney disease with stage 1 through stage 4 chronic kidney disease, or unspecified chronic kidney disease: Secondary | ICD-10-CM | POA: Diagnosis not present

## 2016-12-18 DIAGNOSIS — N183 Chronic kidney disease, stage 3 (moderate): Secondary | ICD-10-CM | POA: Diagnosis not present

## 2016-12-18 DIAGNOSIS — D631 Anemia in chronic kidney disease: Secondary | ICD-10-CM | POA: Diagnosis not present

## 2017-01-04 ENCOUNTER — Ambulatory Visit: Payer: 59 | Admitting: Internal Medicine

## 2017-02-22 ENCOUNTER — Other Ambulatory Visit: Payer: Self-pay | Admitting: Internal Medicine

## 2017-02-22 DIAGNOSIS — B20 Human immunodeficiency virus [HIV] disease: Secondary | ICD-10-CM

## 2017-02-23 DIAGNOSIS — N183 Chronic kidney disease, stage 3 (moderate): Secondary | ICD-10-CM | POA: Diagnosis not present

## 2017-02-23 DIAGNOSIS — I129 Hypertensive chronic kidney disease with stage 1 through stage 4 chronic kidney disease, or unspecified chronic kidney disease: Secondary | ICD-10-CM | POA: Diagnosis not present

## 2017-04-21 DIAGNOSIS — I129 Hypertensive chronic kidney disease with stage 1 through stage 4 chronic kidney disease, or unspecified chronic kidney disease: Secondary | ICD-10-CM | POA: Diagnosis not present

## 2017-04-30 ENCOUNTER — Other Ambulatory Visit: Payer: Self-pay | Admitting: Internal Medicine

## 2017-04-30 DIAGNOSIS — B20 Human immunodeficiency virus [HIV] disease: Secondary | ICD-10-CM

## 2017-05-19 ENCOUNTER — Other Ambulatory Visit: Payer: Self-pay

## 2017-05-19 ENCOUNTER — Telehealth: Payer: Self-pay | Admitting: Endocrinology

## 2017-05-19 DIAGNOSIS — E113491 Type 2 diabetes mellitus with severe nonproliferative diabetic retinopathy without macular edema, right eye: Secondary | ICD-10-CM | POA: Diagnosis not present

## 2017-05-19 DIAGNOSIS — H3561 Retinal hemorrhage, right eye: Secondary | ICD-10-CM | POA: Diagnosis not present

## 2017-05-19 DIAGNOSIS — E113412 Type 2 diabetes mellitus with severe nonproliferative diabetic retinopathy with macular edema, left eye: Secondary | ICD-10-CM | POA: Diagnosis not present

## 2017-05-19 DIAGNOSIS — H3581 Retinal edema: Secondary | ICD-10-CM | POA: Diagnosis not present

## 2017-05-19 MED ORDER — BASAGLAR KWIKPEN 100 UNIT/ML ~~LOC~~ SOPN: 35.0000 [IU] | PEN_INJECTOR | SUBCUTANEOUS | 1 refills | Status: DC

## 2017-05-19 NOTE — Telephone Encounter (Signed)
Done

## 2017-05-19 NOTE — Telephone Encounter (Signed)
Script sent over for Pt  Insulin Glargine [facility]) 100 UNIT/ML [facility]    [facility]  [address], [place], [place] [address]

## 2017-05-20 DIAGNOSIS — D509 Iron deficiency anemia, unspecified: Secondary | ICD-10-CM | POA: Diagnosis not present

## 2017-05-20 DIAGNOSIS — N183 Chronic kidney disease, stage 3 (moderate): Secondary | ICD-10-CM | POA: Diagnosis not present

## 2017-05-20 DIAGNOSIS — D631 Anemia in chronic kidney disease: Secondary | ICD-10-CM | POA: Diagnosis not present

## 2017-05-20 DIAGNOSIS — N2581 Secondary hyperparathyroidism of renal origin: Secondary | ICD-10-CM | POA: Diagnosis not present

## 2017-05-20 DIAGNOSIS — I129 Hypertensive chronic kidney disease with stage 1 through stage 4 chronic kidney disease, or unspecified chronic kidney disease: Secondary | ICD-10-CM | POA: Diagnosis not present

## 2017-05-31 ENCOUNTER — Other Ambulatory Visit: Payer: Self-pay | Admitting: Internal Medicine

## 2017-05-31 DIAGNOSIS — B20 Human immunodeficiency virus [HIV] disease: Secondary | ICD-10-CM

## 2017-06-28 ENCOUNTER — Other Ambulatory Visit: Payer: Self-pay | Admitting: Internal Medicine

## 2017-06-28 DIAGNOSIS — B20 Human immunodeficiency virus [HIV] disease: Secondary | ICD-10-CM

## 2017-07-14 ENCOUNTER — Other Ambulatory Visit: Payer: Self-pay | Admitting: *Deleted

## 2017-07-14 DIAGNOSIS — B20 Human immunodeficiency virus [HIV] disease: Secondary | ICD-10-CM

## 2017-07-14 MED ORDER — ABACAVIR-DOLUTEGRAVIR-LAMIVUD 600-50-300 MG PO TABS: ORAL_TABLET | ORAL | 1 refills | Status: DC

## 2017-08-03 ENCOUNTER — Other Ambulatory Visit: Payer: Self-pay | Admitting: *Deleted

## 2017-08-03 DIAGNOSIS — B20 Human immunodeficiency virus [HIV] disease: Secondary | ICD-10-CM

## 2017-08-03 DIAGNOSIS — Z113 Encounter for screening for infections with a predominantly sexual mode of transmission: Secondary | ICD-10-CM

## 2017-08-05 ENCOUNTER — Other Ambulatory Visit: Payer: 59

## 2017-08-05 ENCOUNTER — Other Ambulatory Visit: Payer: Self-pay | Admitting: *Deleted

## 2017-08-05 ENCOUNTER — Other Ambulatory Visit (HOSPITAL_COMMUNITY)
Admission: RE | Admit: 2017-08-05 | Discharge: 2017-08-05 | Disposition: A | Discharge status: Home / Self Care | Payer: 59 | Source: Ambulatory Visit | Attending: Internal Medicine | Admitting: Internal Medicine

## 2017-08-05 DIAGNOSIS — Z113 Encounter for screening for infections with a predominantly sexual mode of transmission: Secondary | ICD-10-CM | POA: Diagnosis not present

## 2017-08-05 DIAGNOSIS — B20 Human immunodeficiency virus [HIV] disease: Secondary | ICD-10-CM

## 2017-08-06 LAB — COMPLETE METABOLIC PANEL WITH GFR
AG RATIO: 1.6 (calc) (ref 1.0–2.5)
ALKALINE PHOSPHATASE (APISO): 70 U/L (ref 40–115)
ALT: 24 U/L (ref 9–46)
AST: 46 U/L — AB (ref 10–40)
Albumin: 3.9 g/dL (ref 3.6–5.1)
BILIRUBIN TOTAL: 0.4 mg/dL (ref 0.2–1.2)
BUN/Creatinine Ratio: 9 (calc) (ref 6–22)
BUN: 33 mg/dL — ABNORMAL HIGH (ref 7–25)
CHLORIDE: 110 mmol/L (ref 98–110)
CO2: 23 mmol/L (ref 20–32)
Calcium: 9.1 mg/dL (ref 8.6–10.3)
Creat: 3.76 mg/dL — ABNORMAL HIGH (ref 0.60–1.35)
GFR, EST AFRICAN AMERICAN: 23 mL/min/{1.73_m2} — AB (ref >=60)
GFR, Est Non African American: 20 mL/min/{1.73_m2} — ABNORMAL LOW (ref >=60)
GLUCOSE: 63 mg/dL — AB (ref 65–99)
Globulin: 2.5 g/dL (calc) (ref 1.9–3.7)
POTASSIUM: 5.2 mmol/L (ref 3.5–5.3)
Sodium: 140 mmol/L (ref 135–146)
Total Protein: 6.4 g/dL (ref 6.1–8.1)

## 2017-08-06 LAB — CBC WITH DIFFERENTIAL/PLATELET
Basophils Absolute: 31 cells/uL (ref 0–200)
Basophils Relative: 0.5 %
EOS PCT: 2.5 %
Eosinophils Absolute: 153 cells/uL (ref 15–500)
HCT: 31.9 % — ABNORMAL LOW (ref 38.5–50.0)
HEMOGLOBIN: 11.1 g/dL — AB (ref 13.2–17.1)
Lymphs Abs: 2013 cells/uL (ref 850–3900)
MCH: 33.7 pg — ABNORMAL HIGH (ref 27.0–33.0)
MCHC: 34.8 g/dL (ref 32.0–36.0)
MCV: 97 fL (ref 80.0–100.0)
MONOS PCT: 6.2 %
MPV: 9.3 fL (ref 7.5–12.5)
NEUTROS ABS: 3526 {cells}/uL (ref 1500–7800)
Neutrophils Relative %: 57.8 %
Platelets: 305 10*3/uL (ref 140–400)
RBC: 3.29 10*6/uL — AB (ref 4.20–5.80)
RDW: 14.4 % (ref 11.0–15.0)
Total Lymphocyte: 33 %
WBC mixed population: 378 cells/uL (ref 200–950)
WBC: 6.1 10*3/uL (ref 3.8–10.8)

## 2017-08-06 LAB — T-HELPER CELL (CD4) - (RCID CLINIC ONLY)
CD4 T CELL HELPER: 45 % (ref 33–55)
CD4 T Cell Abs: 950 /uL (ref 400–2700)

## 2017-08-06 LAB — URINE CYTOLOGY ANCILLARY ONLY
CHLAMYDIA, DNA PROBE: NEGATIVE
NEISSERIA GONORRHEA: NEGATIVE

## 2017-08-06 LAB — RPR: RPR Ser Ql: NONREACTIVE

## 2017-08-07 LAB — HIV-1 RNA QUANT-NO REFLEX-BLD
HIV 1 RNA Quant: <20 copies/mL — AB
HIV-1 RNA Quant, Log: <1.3 Log copies/mL — AB

## 2017-08-15 ENCOUNTER — Other Ambulatory Visit: Payer: Self-pay | Admitting: Endocrinology

## 2017-08-15 NOTE — Telephone Encounter (Signed)
Please refill x 1 Ov is due  

## 2017-08-17 ENCOUNTER — Other Ambulatory Visit: Payer: Self-pay

## 2017-08-17 MED ORDER — BASAGLAR KWIKPEN 100 UNIT/ML ~~LOC~~ SOPN: 35.0000 [IU] | PEN_INJECTOR | SUBCUTANEOUS | 1 refills | Status: DC

## 2017-08-19 ENCOUNTER — Encounter: Payer: Self-pay | Admitting: Internal Medicine

## 2017-08-19 ENCOUNTER — Ambulatory Visit (INDEPENDENT_AMBULATORY_CARE_PROVIDER_SITE_OTHER): Payer: 59 | Admitting: Internal Medicine

## 2017-08-19 ENCOUNTER — Other Ambulatory Visit: Payer: Self-pay

## 2017-08-19 VITALS — BP 130/79 | HR 86 | Temp 98.2°F | Ht 71.0 in | Wt 170.0 lb

## 2017-08-19 DIAGNOSIS — N183 Chronic kidney disease, stage 3 unspecified: Secondary | ICD-10-CM

## 2017-08-19 DIAGNOSIS — E1022 Type 1 diabetes mellitus with diabetic chronic kidney disease: Secondary | ICD-10-CM

## 2017-08-19 DIAGNOSIS — B20 Human immunodeficiency virus [HIV] disease: Secondary | ICD-10-CM | POA: Diagnosis not present

## 2017-08-19 DIAGNOSIS — N189 Chronic kidney disease, unspecified: Secondary | ICD-10-CM | POA: Insufficient documentation

## 2017-08-19 NOTE — Assessment & Plan Note (Signed)
Doing well from this standpoint.

## 2017-08-19 NOTE — Assessment & Plan Note (Signed)
I discussed need to get back into care with endocrinology.  His last Hgb A1C a year ago was 10.

## 2017-08-19 NOTE — Progress Notes (Signed)
   Subjective:    Patient ID: Chase Rollins, male    DOB: [birth year], 37 y.o.   MRN: [id number]  HPI Here for follow up of HIV. He has remained on Triumeq and denies any recent missed doses.  Has not been here in 1 year.  CD4 though 950 and virus remains < 20.  He also has type 1 diabetes but has not seen his endocrinologist for about 1 year.  He is due to see Dr. Jimmy Footman in May.  His creat here is up to 3.76 with an estimated GFR of around 20.  No associated n/d/rash.  Some occasional loose stools with food, possibly caused by dairy.  Uses condoms with sexual activity.    Review of Systems  Constitutional: Negative for fatigue.  Gastrointestinal: Negative for nausea.  Skin: Negative for rash.       Objective:   Physical Exam  Constitutional: He appears well-developed and well-nourished. No distress.  HENT:  Mouth/Throat: No oropharyngeal exudate.  Eyes: No scleral icterus.  Cardiovascular: Normal rate, regular rhythm and normal heart sounds.  No murmur heard. Pulmonary/Chest: Effort normal and breath sounds normal. No respiratory distress.  Skin: No rash noted.   SH: no tobacco       Assessment & Plan:

## 2017-08-19 NOTE — Assessment & Plan Note (Signed)
He is going to see nephrology in May.  If his renal function remains low, will need to look at different treatment options.  Probably can do Biktarvy one pill daily at a low GFR.

## 2017-09-03 ENCOUNTER — Other Ambulatory Visit: Payer: Self-pay | Admitting: Internal Medicine

## 2017-09-03 DIAGNOSIS — B20 Human immunodeficiency virus [HIV] disease: Secondary | ICD-10-CM

## 2017-09-24 ENCOUNTER — Telehealth: Payer: Self-pay

## 2017-09-24 NOTE — Telephone Encounter (Signed)
Left message for patient to call back to make a fasting appointment with Dorothea Ogle.

## 2017-09-24 NOTE — Telephone Encounter (Signed)
Patient has called back and made appointment

## 2017-09-27 ENCOUNTER — Ambulatory Visit (INDEPENDENT_AMBULATORY_CARE_PROVIDER_SITE_OTHER): Payer: 59 | Admitting: Medical

## 2017-09-27 ENCOUNTER — Encounter: Payer: Self-pay | Admitting: Medical

## 2017-09-27 ENCOUNTER — Other Ambulatory Visit: Payer: Self-pay | Admitting: Medical

## 2017-09-27 VITALS — BP 138/78 | HR 92 | Temp 98.0°F | Ht 72.0 in | Wt 176.0 lb

## 2017-09-27 DIAGNOSIS — B20 Human immunodeficiency virus [HIV] disease: Secondary | ICD-10-CM | POA: Diagnosis not present

## 2017-09-27 DIAGNOSIS — E1022 Type 1 diabetes mellitus with diabetic chronic kidney disease: Secondary | ICD-10-CM | POA: Diagnosis not present

## 2017-09-27 DIAGNOSIS — N183 Chronic kidney disease, stage 3 unspecified: Secondary | ICD-10-CM

## 2017-09-27 DIAGNOSIS — E782 Mixed hyperlipidemia: Secondary | ICD-10-CM

## 2017-09-27 DIAGNOSIS — I10 Essential (primary) hypertension: Secondary | ICD-10-CM

## 2017-09-27 DIAGNOSIS — H3500 Unspecified background retinopathy: Secondary | ICD-10-CM

## 2017-09-27 DIAGNOSIS — Z9119 Patient's noncompliance with other medical treatment and regimen: Secondary | ICD-10-CM

## 2017-09-27 DIAGNOSIS — D649 Anemia, unspecified: Secondary | ICD-10-CM

## 2017-09-27 DIAGNOSIS — Z91199 Patient's noncompliance with other medical treatment and regimen due to unspecified reason: Secondary | ICD-10-CM

## 2017-09-27 NOTE — Patient Instructions (Signed)
Encounter Diagnoses  Name Primary?  . Type 1 diabetes mellitus with stage 3 chronic kidney disease ([place]) Yes  . Mixed hyperlipidemia   . Essential hypertension   . HIV disease ([place])   . Stage 3 chronic kidney disease ([place])   . Anemia, unspecified type   . Noncompliance   . Retinopathy    Recommendations:  You should be seeing your diabetes specialist every 3 months!  You need routine follow up with diabetes for labs, monitoring and prevention of diabetic complications.   You have retinopathy and kidney damage already!  We don't need any other complications.  Continue to see infectious disease and kidney doctor regularly See your eye doctor yearly for routine vision care.  See your dentist yearly for routine dental care including hygiene visits twice yearly.  I want to do and EKG the next time I see you for heart disease screening and monitoring for blood pressure  Continue checking your sugars 3 times daily  Eat 4-5 small meals throughout the day. Don't skip meals.  I want to see you back in 3 months.  Type 2 Diabetes  Diabetes is a long-lasting (chronic) disease.  With diabetes, either the pancreas does not make enough of a hormone called insulin, or the body has trouble using the insulin that is made.  Over time, diabetes can damage the eyes, kidneys, and nerves causing retinopathy, nephropathy, and neuropathy.  Diabetes puts you at risk for heart disease and peripheral vascular disease which can lead to heart attack, stroke, foot ulcers, and amputations.    Our goal and hopefully your goal is to manage your diabetes in such a way to slow the progression of the disease and do all we can to keep you healthy  Home Care:   Eat healthy, exercise regularly, limit alcohol, and don't smoke!  Check your blood sugar (glucose) once a day before breakfast, or as indicated by our discussion today.  Take your medications daily, don't run out of medications.  Learn about low blood  sugar (hypoglycemia). Know how to treat it.  Wear a necklace or bracelet that says you have diabetes.  Check your feet every night for cuts, sores, blisters, and redness. Tell your medical provider if you have problems.  Maintain a normal body weight, or normal BMI - height to weight ratio of 20-25.  Ask me about this.  GET HELP RIGHT AWAY IF:  You have trouble keeping your blood sugar in target range.  You have problems with your medicines.  You are sick and not getting better after 24 hours.  You have a sore or wound that is not healing.  You have vision problems or changes.  You have a fever.  Diet: health low carb heart health diet such as Mediterranean or [employer]  Exercise regularly since it has beneficial effects on the heart and blood sugars. Exercising at least three times per week or 150 minutes per week can be as important as medication to a diabetic.  Find some form of exercise that you will enjoy doing regularly.  This can include walking, biking, kayaking, golfing, swimming, dance, aerobics, hiking, etc.  If you have joint problems, many local gyms have equipment to accommodate people with specific needs.    Vaccinations:  Diabetics are at increased risk for infection, and illnesses can take longer to resolve.  Current vaccine recommendations include yearly Influenza (flu) vaccine (recommended in October), Pneumococcal vaccine, Hepatitis B vaccine series, Tdap (tetanus, diptheria and pertussis) vaccine every  10 years, and other age appropriate vaccinations.     Office visits:  We recommend routine medical care to make sure we are addressing prevention and issues as they arise.  Typically this could mean twice yearly or up to quarterly depending upon your unique health situation.  Exams should include a yearly physical, a yearly foot exam, and other examination as appropriate.  You should see an eye doctor yearly to help screen for and prevent blood vessel  complications in your eyes.  Labs: Diabetics should have blood work done at least twice yearly to monitor your Hemoglobin A1C (a three-month average of your blood sugars) and your cholesterol.  You should have your urine and blood checked yearly to screen for kidney damage.  This may include creatinine and micro-albumin levels.  Other labs as appropriate.    Blood pressure goals:  Goal blood pressure in diabetics should be 130/80 or less. Monitoring your blood pressure with a home blood pressure cuff of your own is an excellent idea.  If you are prescribed medication for blood pressure, take your medication every day, and don't run out of medication.  Having high blood pressure can damage your heart, eyes, kidneys, and put you at risk for heart attack and stroke.  Tobacco use:  If you smoke, dip or chew, quitting will reduce your risk of heart attack, stroke, peripheral vascular disease, and many cancers.

## 2017-09-27 NOTE — Progress Notes (Signed)
Subjective: Chief Complaint  Patient presents with  . Diabetes   Medical team: Dr. Scharlene Gloss, infectious disease Nalina Yeatman, Camelia Eng, PA-C here for primary care Dr. Renato Shin, endocrinology Dr. Jeneen Rinks Deterdeing, nephrology Dr. Zadie Rhine, ophthalmology  Here for f/u.   This is my second visit with him.  He came in as a new patient 05/2016.  At that time I referred to endocrinology, Dr. Renato Shin who he saw 07/2016.   However, he has not been back for f/u with endocrinology.  He sees infectious disease regularly for HIV disease x last 3 years.  He sees nephology Dr. Jeneen Rinks Deterding, just saw them last week, had a panel of labs and f/u. Had recent discussion about referral to class about dialysis.  Kidney reportedly functioning at 20%.   He has recently stopped soda, trying to drink more water.  HTN - compliant with Amlodipine 5mg  daily, Lisinopril HCT 20/12.5mg  daily, and Lisinopril 40mg  at bedtime added recently by nephrology.  Hyperlipidemia - compliant with Pravachol 20mg  daily  Diabetes type 1 - compliant with Insulin GlargineBasaglar 35 u daily in morning.  Not taking any meal time insulin.  Sugars running all under 100, but also getting some 60-70s and feels a little weak with low readings.     No new c/o.   Past Medical History:  Diagnosis Date  . Diabetes ([place])   . HIV (human immunodeficiency virus infection) ([place])   . HTN (hypertension)    Current Outpatient Medications on File Prior to Visit  Medication Sig Dispense Refill  . amLODipine (NORVASC) 5 MG tablet Take by mouth.    . calcitRIOL (ROCALTROL) 0.25 MCG capsule Take by mouth daily.  6  . Insulin Glargine (BASAGLAR KWIKPEN) 100 UNIT/ML SOPN Inject 0.35 mLs (35 Units total) into the skin every morning. 45 mL 1  . lisinopril (PRINIVIL,ZESTRIL) 40 MG tablet Take 40 mg by mouth at bedtime.  3  . lisinopril-hydrochlorothiazide (PRINZIDE,ZESTORETIC) 20-12.5 MG tablet TAKE 1 TABLET BY MOUTH DAILY. 90 tablet 0  .  pravastatin (PRAVACHOL) 20 MG tablet TAKE 1 TABLET BY MOUTH EVERY DAY 90 tablet 0  . TRIUMEQ 600-50-300 MG tablet TAKE ONE TABLET BY MOUTH ONCE DAILY. STORE IN ORIGINAL BOTTLE AT [facility]. 30 tablet 5  . atorvastatin (LIPITOR) 10 MG tablet Take by mouth.     No current facility-administered medications on file prior to visit.    ROS as in subjective   Objective: BP 138/78   Pulse 92   Temp 98 F (36.7 C) (Oral)   Ht 6' (1.829 m)   Wt 176 lb (79.8 kg)   SpO2 99%   BMI 23.87 kg/m   BP Readings from Last 3 Encounters:  09/27/17 138/78  08/19/17 130/79  08/05/16 (!) 158/102   Wt Readings from Last 3 Encounters:  09/27/17 176 lb (79.8 kg)  08/19/17 170 lb (77.1 kg)  08/05/16 175 lb (79.4 kg)   General appearance: alert, no distress, WD/WN, AA male, fully dressed with dress shoes and tie, didn't want to get undressed Neck: supple, no lymphadenopathy, no thyromegaly, no masses, no bruits Heart: RRR, normal S1, S2, no murmurs Lungs: CTA bilaterally, no wheezes, rhonchi, or rales Abdomen: +bs, soft, non tender, non distended, no masses, no hepatomegaly, no splenomegaly Pulses: 2+ symmetric, upper and lower extremities, normal cap refill Ext: no edema  Declined EKG and foot exam today    Assessment: Encounter Diagnoses  Name Primary?  . Type 1 diabetes mellitus with stage 3 chronic kidney disease ([place]) Yes  .  Mixed hyperlipidemia   . Essential hypertension   . HIV disease ([place])   . Stage 3 chronic kidney disease ([place])   . Anemia, unspecified type   . Noncompliance   . Retinopathy      Plan: He was fully dressed and declined foot exam and EKG today, was running behind getting to work.   Discussed his medical history, diabetes with complications, lack of f/u here and with endocrinology!   He has seen me and endocrinology once in a year!!    Discussed medications, will get recent lab results and notes from nephrology seen within last few weeks.     Recommendations:  You should be seeing your diabetes specialist every 3 months!  You need routine follow up with diabetes for labs, monitoring and prevention of diabetic complications.   You have retinopathy and kidney damage already!  We don't need any other complications.  Continue to see infectious disease and kidney doctor regularly See your eye doctor yearly for routine vision care.  See your dentist yearly for routine dental care including hygiene visits twice yearly.  I want to do and EKG the next time I see you for heart disease screening and monitoring for blood pressure  Continue checking your sugars 3 times daily  Eat 4-5 small meals throughout the day. Don't skip meals.  I want to see you back in 3 months.   Elaine was seen today for diabetes.  Diagnoses and all orders for this visit:  Type 1 diabetes mellitus with stage 3 chronic kidney disease (H[facility]) -     Insulin and C-Peptide -     Hemoglobin A1c  Mixed hyperlipidemia -     Hepatic function panel -     Lipid panel  Essential hypertension  HIV disease ([facility])  Stage 3 chronic kidney disease ([facility])  Anemia, unspecified type  Noncompliance  Retinopathy

## 2017-09-28 ENCOUNTER — Other Ambulatory Visit: Payer: Self-pay | Admitting: Medical

## 2017-09-28 LAB — HEPATIC FUNCTION PANEL
ALBUMIN: 4.2 g/dL (ref 3.5–5.5)
ALK PHOS: 78 IU/L (ref 39–117)
ALT: 13 IU/L (ref 0–44)
AST: 14 IU/L (ref 0–40)
BILIRUBIN TOTAL: 0.3 mg/dL (ref 0.0–1.2)
BILIRUBIN, DIRECT: 0.07 mg/dL (ref 0.00–0.40)
TOTAL PROTEIN: 6.5 g/dL (ref 6.0–8.5)

## 2017-09-28 LAB — LIPID PANEL
CHOL/HDL RATIO: 3.4 ratio (ref 0.0–5.0)
Cholesterol, Total: 135 mg/dL (ref 100–199)
HDL: 40 mg/dL (ref >39)
LDL CALC: 77 mg/dL (ref 0–99)
TRIGLYCERIDES: 91 mg/dL (ref 0–149)
VLDL Cholesterol Cal: 18 mg/dL (ref 5–40)

## 2017-09-28 LAB — INSULIN AND C-PEPTIDE, SERUM
C PEPTIDE: 0.2 ng/mL — AB (ref 1.1–4.4)
INSULIN: 2.6 u[IU]/mL (ref 2.6–24.9)

## 2017-09-28 LAB — HEMOGLOBIN A1C
ESTIMATED AVERAGE GLUCOSE: 146 mg/dL
HEMOGLOBIN A1C: 6.7 % — AB (ref 4.8–5.6)

## 2017-09-28 MED ORDER — ATORVASTATIN CALCIUM 10 MG PO TABS: 10.0000 mg | ORAL_TABLET | Freq: Every day | ORAL | 3 refills | Status: DC

## 2017-09-28 MED ORDER — BASAGLAR KWIKPEN 100 UNIT/ML ~~LOC~~ SOPN: 35.0000 [IU] | PEN_INJECTOR | SUBCUTANEOUS | 2 refills | Status: DC

## 2017-10-04 ENCOUNTER — Encounter: Payer: Self-pay | Admitting: Endocrinology

## 2017-10-04 ENCOUNTER — Ambulatory Visit (INDEPENDENT_AMBULATORY_CARE_PROVIDER_SITE_OTHER): Payer: 59 | Admitting: Endocrinology

## 2017-10-04 VITALS — BP 110/76 | HR 91 | Wt 175.4 lb

## 2017-10-04 DIAGNOSIS — N183 Chronic kidney disease, stage 3 unspecified: Secondary | ICD-10-CM

## 2017-10-04 DIAGNOSIS — E1022 Type 1 diabetes mellitus with diabetic chronic kidney disease: Secondary | ICD-10-CM

## 2017-10-04 MED ORDER — BASAGLAR KWIKPEN 100 UNIT/ML ~~LOC~~ SOPN
32.0000 [IU] | PEN_INJECTOR | SUBCUTANEOUS | 2 refills | Status: DC
Start: 2017-10-04 — End: 2018-01-04

## 2017-10-04 NOTE — Progress Notes (Signed)
Subjective:    Patient ID: Chase Rollins, male    DOB: February 03, 1981, 37 y.o.   MRN: [id number]  HPI Pt returns for f/u of diabetes mellitus: DM type: 1 Dx'ed: 6270 Complications: polyneuropathy renal failure Therapy: insulin since soon after dx.  DKA: never Severe hypoglycemia: never Pancreatitis: never Pancreatic imaging: never Other: he takes qd insulin, after poor results with more frequent injections.  Interval history: no cbg record, but states cbg's vary from 60-140.  It is lowest after a meal is missed, delayed, or smaller than expected.  pt states he feels well in general.  Past Medical History:  Diagnosis Date  . Diabetes ([place])   . HIV (human immunodeficiency virus infection) ([place])   . HTN (hypertension)     History reviewed. No pertinent surgical history.  Social History   Socioeconomic History  . Marital status: Single    Spouse name: Not on file  . Number of children: Not on file  . Years of education: Not on file  . Highest education level: Not on file  Occupational History  . Not on file  Social Needs  . Financial resource strain: Not on file  . Food insecurity:    Worry: Not on file    Inability: Not on file  . Transportation needs:    Medical: Not on file    Non-medical: Not on file  Tobacco Use  . Smoking status: Never Smoker  . Smokeless tobacco: Never Used  Substance and Sexual Activity  . Alcohol use: Yes    Comment: socially  . Drug use: No  . Sexual activity: Yes    Partners: Male    Comment: pt declined condoms  Lifestyle  . Physical activity:    Days per week: Not on file    Minutes per session: Not on file  . Stress: Not on file  Relationships  . Social connections:    Talks on phone: Not on file    Gets together: Not on file    Attends religious service: Not on file    Active member of club or organization: Not on file    Attends meetings of clubs or organizations: Not on file    Relationship status: Not on file  .  Intimate partner violence:    Fear of current or ex partner: Not on file    Emotionally abused: Not on file    Physically abused: Not on file    Forced sexual activity: Not on file  Other Topics Concern  . Not on file  Social History Narrative  . Not on file    Current Outpatient Medications on File Prior to Visit  Medication Sig Dispense Refill  . amLODipine (NORVASC) 5 MG tablet Take by mouth.    Marland Kitchen atorvastatin (LIPITOR) 10 MG tablet Take 1 tablet (10 mg total) by mouth daily at 6 PM. 90 tablet 3  . calcitRIOL (ROCALTROL) 0.25 MCG capsule Take by mouth daily.  6  . lisinopril (PRINIVIL,ZESTRIL) 40 MG tablet Take 40 mg by mouth at bedtime.  3  . lisinopril-hydrochlorothiazide (PRINZIDE,ZESTORETIC) 20-12.5 MG tablet TAKE 1 TABLET BY MOUTH DAILY. 90 tablet 0  . TRIUMEQ 600-50-300 MG tablet TAKE ONE TABLET BY MOUTH ONCE DAILY. STORE IN ORIGINAL BOTTLE AT [facility]. 30 tablet 5   No current facility-administered medications on file prior to visit.     No Known Allergies  Family History  Problem Relation Age of Onset  . Diabetes Mother   . Diabetes Paternal Grandmother  BP 110/76   Pulse 91   Wt 175 lb 6.4 oz (79.6 kg)   SpO2 97%   BMI 23.79 kg/m    Review of Systems Denies LOC    Objective:   Physical Exam VITAL SIGNS:  See vs page GENERAL: no distress Pulses: dorsalis pedis intact bilat.   MSK: no deformity of the feet CV: no leg edema Skin:  no ulcer on the feet.  normal color and temp on the feet. Neuro: sensation is intact to touch on the feet   Lab Results  Component Value Date   CREATININE 3.76 (H) 08/05/2017   BUN 33 (H) 08/05/2017   NA 140 08/05/2017   K 5.2 08/05/2017   CL 110 08/05/2017   CO2 23 08/05/2017    Lab Results  Component Value Date   HGBA1C 6.7 (H) 09/27/2017       Assessment & Plan:  Type 1 DM, with renal failure, overcontrolled, given this regimen, which does match insulin to his changing needs throughout the  day  Patient Instructions  check your blood sugar twice a day.  vary the time of day when you check, between before the 3 meals, and at bedtime.  also check if you have symptoms of your blood sugar being too high or too low.  please keep a record of the readings and bring it to your next appointment here (or you can bring the meter itself).  You can write it on any piece of paper.  please call us sooner if your blood sugar goes below 70, or if you have a lot of readings over 200.   Please reduce the basaglar to 32 units each morning. On this type of insulin schedule, you should eat meals on a regular schedule.  If a meal is missed or significantly delayed, your blood sugar could go low.  Please come back for a follow-up appointment in 3 months.

## 2017-10-04 NOTE — Patient Instructions (Addendum)
check your blood sugar twice a day.  vary the time of day when you check, between before the 3 meals, and at bedtime.  also check if you have symptoms of your blood sugar being too high or too low.  please keep a record of the readings and bring it to your next appointment here (or you can bring the meter itself).  You can write it on any piece of paper.  please call us sooner if your blood sugar goes below 70, or if you have a lot of readings over 200.   Please reduce the basaglar to 32 units each morning. On this type of insulin schedule, you should eat meals on a regular schedule.  If a meal is missed or significantly delayed, your blood sugar could go low.  Please come back for a follow-up appointment in 3 months.

## 2017-12-28 DIAGNOSIS — N2581 Secondary hyperparathyroidism of renal origin: Secondary | ICD-10-CM | POA: Diagnosis not present

## 2017-12-28 DIAGNOSIS — N184 Chronic kidney disease, stage 4 (severe): Secondary | ICD-10-CM | POA: Diagnosis not present

## 2017-12-28 DIAGNOSIS — N189 Chronic kidney disease, unspecified: Secondary | ICD-10-CM | POA: Diagnosis not present

## 2017-12-28 DIAGNOSIS — I129 Hypertensive chronic kidney disease with stage 1 through stage 4 chronic kidney disease, or unspecified chronic kidney disease: Secondary | ICD-10-CM | POA: Diagnosis not present

## 2017-12-28 DIAGNOSIS — D631 Anemia in chronic kidney disease: Secondary | ICD-10-CM | POA: Diagnosis not present

## 2018-01-04 ENCOUNTER — Encounter: Payer: Self-pay | Admitting: Endocrinology

## 2018-01-04 ENCOUNTER — Ambulatory Visit (INDEPENDENT_AMBULATORY_CARE_PROVIDER_SITE_OTHER): Payer: 59 | Admitting: Endocrinology

## 2018-01-04 VITALS — BP 138/88 | HR 90 | Ht 72.0 in | Wt 173.8 lb

## 2018-01-04 DIAGNOSIS — N183 Chronic kidney disease, stage 3 unspecified: Secondary | ICD-10-CM

## 2018-01-04 DIAGNOSIS — E1022 Type 1 diabetes mellitus with diabetic chronic kidney disease: Secondary | ICD-10-CM

## 2018-01-04 LAB — POCT GLYCOSYLATED HEMOGLOBIN (HGB A1C): Hemoglobin A1C: 6.6 % — AB (ref 4.0–5.6)

## 2018-01-04 MED ORDER — BASAGLAR KWIKPEN 100 UNIT/ML ~~LOC~~ SOPN: 30.0000 [IU] | PEN_INJECTOR | SUBCUTANEOUS | 2 refills | Status: DC

## 2018-01-04 NOTE — Patient Instructions (Addendum)
check your blood sugar twice a day.  vary the time of day when you check, between before the 3 meals, and at bedtime.  also check if you have symptoms of your blood sugar being too high or too low.  please keep a record of the readings and bring it to your next appointment here (or you can bring the meter itself).  You can write it on any piece of paper.  please call us sooner if your blood sugar goes below 70, or if you have a lot of readings over 200.   Please reduce the basaglar to 30 units each morning. On this type of insulin schedule, you should eat meals on a regular schedule.  If a meal is missed or significantly delayed, your blood sugar could go low.  Please come back for a follow-up appointment in 3 months.

## 2018-01-04 NOTE — Progress Notes (Signed)
Subjective:    Patient ID: Chase Rollins, male    DOB: Dec 21, 1980, 37 y.o.   MRN: [id number]  HPI Pt returns for f/u of diabetes mellitus: DM type: 1 Dx'ed: 1829 Complications: polyneuropathy, DR, and renal failure.   Therapy: insulin since soon after dx.  DKA: never Severe hypoglycemia: never.  Pancreatitis: never.  Pancreatic imaging: never.  Other: he takes qd insulin, after poor results with more frequent injections.  Interval history: no cbg record, but states cbg's vary from 60-170.  It is lowest after a meal is missed, delayed, or smaller than expected.  pt states he feels well in general.   Past Medical History:  Diagnosis Date  . Diabetes ([place])   . HIV (human immunodeficiency virus infection) ([place])   . HTN (hypertension)     No past surgical history on file.  Social History   Socioeconomic History  . Marital status: Single    Spouse name: Not on file  . Number of children: Not on file  . Years of education: Not on file  . Highest education level: Not on file  Occupational History  . Not on file  Social Needs  . Financial resource strain: Not on file  . Food insecurity:    Worry: Not on file    Inability: Not on file  . Transportation needs:    Medical: Not on file    Non-medical: Not on file  Tobacco Use  . Smoking status: Never Smoker  . Smokeless tobacco: Never Used  Substance and Sexual Activity  . Alcohol use: Yes    Comment: socially  . Drug use: No  . Sexual activity: Yes    Partners: Male    Comment: pt declined condoms  Lifestyle  . Physical activity:    Days per week: Not on file    Minutes per session: Not on file  . Stress: Not on file  Relationships  . Social connections:    Talks on phone: Not on file    Gets together: Not on file    Attends religious service: Not on file    Active member of club or organization: Not on file    Attends meetings of clubs or organizations: Not on file    Relationship status: Not on file  .  Intimate partner violence:    Fear of current or ex partner: Not on file    Emotionally abused: Not on file    Physically abused: Not on file    Forced sexual activity: Not on file  Other Topics Concern  . Not on file  Social History Narrative  . Not on file    Current Outpatient Medications on File Prior to Visit  Medication Sig Dispense Refill  . amLODipine (NORVASC) 5 MG tablet Take by mouth.    Marland Kitchen atorvastatin (LIPITOR) 10 MG tablet Take 1 tablet (10 mg total) by mouth daily at 6 PM. 90 tablet 3  . calcitRIOL (ROCALTROL) 0.25 MCG capsule Take by mouth daily.  6  . lisinopril (PRINIVIL,ZESTRIL) 40 MG tablet Take 40 mg by mouth at bedtime.  3  . lisinopril-hydrochlorothiazide (PRINZIDE,ZESTORETIC) 20-12.5 MG tablet TAKE 1 TABLET BY MOUTH DAILY. 90 tablet 0  . TRIUMEQ 600-50-300 MG tablet TAKE ONE TABLET BY MOUTH ONCE DAILY. STORE IN ORIGINAL BOTTLE AT [facility]. 30 tablet 5   No current facility-administered medications on file prior to visit.     No Known Allergies  Family History  Problem Relation Age of Onset  . Diabetes Mother   .  Diabetes Paternal Grandmother     BP 138/88 (BP Location: Left Arm, Patient Position: Sitting, Cuff Size: Normal)   Pulse 90   Ht 6' (1.829 m)   Wt 173 lb 12.8 oz (78.8 kg)   SpO2 98%   BMI 23.57 kg/m    Review of Systems Denies LOC    Objective:   Physical Exam VITAL SIGNS:  See vs page GENERAL: no distress Pulses: foot pulses are intact bilaterally.   MSK: no deformity of the feet or ankles.  CV: no edema of the legs or ankles Skin:  no ulcer on the feet or ankles.  normal color and temp on the feet and ankles Neuro: sensation is intact to touch on the feet and ankles.     Lab Results  Component Value Date   CREATININE 3.76 (H) 08/05/2017   BUN 33 (H) 08/05/2017   NA 140 08/05/2017   K 5.2 08/05/2017   CL 110 08/05/2017   CO2 23 08/05/2017   Lab Results  Component Value Date   HGBA1C 6.6 (A) 01/04/2018         Assessment & Plan:  Type 1 DM, with DR: overcontrolled.  Hypoglycemia: I advised pt to please call us if this continues to happen Renal failure: he is at risk for further hypoglycemia.  Patient Instructions  check your blood sugar twice a day.  vary the time of day when you check, between before the 3 meals, and at bedtime.  also check if you have symptoms of your blood sugar being too high or too low.  please keep a record of the readings and bring it to your next appointment here (or you can bring the meter itself).  You can write it on any piece of paper.  please call us sooner if your blood sugar goes below 70, or if you have a lot of readings over 200.   Please reduce the basaglar to 30 units each morning. On this type of insulin schedule, you should eat meals on a regular schedule.  If a meal is missed or significantly delayed, your blood sugar could go low.  Please come back for a follow-up appointment in 3 months.

## 2018-01-27 DIAGNOSIS — E113412 Type 2 diabetes mellitus with severe nonproliferative diabetic retinopathy with macular edema, left eye: Secondary | ICD-10-CM | POA: Diagnosis not present

## 2018-01-27 DIAGNOSIS — E113491 Type 2 diabetes mellitus with severe nonproliferative diabetic retinopathy without macular edema, right eye: Secondary | ICD-10-CM | POA: Diagnosis not present

## 2018-01-27 DIAGNOSIS — H3561 Retinal hemorrhage, right eye: Secondary | ICD-10-CM | POA: Diagnosis not present

## 2018-02-01 DIAGNOSIS — E113412 Type 2 diabetes mellitus with severe nonproliferative diabetic retinopathy with macular edema, left eye: Secondary | ICD-10-CM | POA: Diagnosis not present

## 2018-02-21 ENCOUNTER — Other Ambulatory Visit: Payer: 59

## 2018-02-21 DIAGNOSIS — B20 Human immunodeficiency virus [HIV] disease: Secondary | ICD-10-CM

## 2018-02-22 LAB — T-HELPER CELL (CD4) - (RCID CLINIC ONLY)
CD4 % Helper T Cell: 48 % (ref 33–55)
CD4 T CELL ABS: 900 /uL (ref 400–2700)

## 2018-02-23 LAB — COMPLETE METABOLIC PANEL WITH GFR
AG Ratio: 1.6 (calc) (ref 1.0–2.5)
ALBUMIN MSPROF: 4.2 g/dL (ref 3.6–5.1)
ALKALINE PHOSPHATASE (APISO): 69 U/L (ref 40–115)
ALT: 18 U/L (ref 9–46)
AST: 12 U/L (ref 10–40)
BUN / CREAT RATIO: 9 (calc) (ref 6–22)
BUN: 36 mg/dL — AB (ref 7–25)
CALCIUM: 9.2 mg/dL (ref 8.6–10.3)
CO2: 23 mmol/L (ref 20–32)
CREATININE: 4.12 mg/dL — AB (ref 0.60–1.35)
Chloride: 108 mmol/L (ref 98–110)
GFR, EST NON AFRICAN AMERICAN: 18 mL/min/{1.73_m2} — AB (ref >=60)
GFR, Est African American: 20 mL/min/{1.73_m2} — ABNORMAL LOW (ref >=60)
GLOBULIN: 2.6 g/dL (ref 1.9–3.7)
GLUCOSE: 124 mg/dL — AB (ref 65–99)
Potassium: 5.5 mmol/L — ABNORMAL HIGH (ref 3.5–5.3)
Sodium: 136 mmol/L (ref 135–146)
TOTAL PROTEIN: 6.8 g/dL (ref 6.1–8.1)
Total Bilirubin: 0.4 mg/dL (ref 0.2–1.2)

## 2018-02-23 LAB — HIV-1 RNA QUANT-NO REFLEX-BLD
HIV 1 RNA Quant: <20 copies/mL
HIV-1 RNA QUANT, LOG: NOT DETECTED {Log_copies}/mL

## 2018-03-07 ENCOUNTER — Encounter: Payer: 59 | Admitting: Internal Medicine

## 2018-03-17 ENCOUNTER — Other Ambulatory Visit: Payer: Self-pay | Admitting: Internal Medicine

## 2018-03-17 DIAGNOSIS — B20 Human immunodeficiency virus [HIV] disease: Secondary | ICD-10-CM

## 2018-03-28 DIAGNOSIS — Z23 Encounter for immunization: Secondary | ICD-10-CM | POA: Diagnosis not present

## 2018-03-28 DIAGNOSIS — D631 Anemia in chronic kidney disease: Secondary | ICD-10-CM | POA: Diagnosis not present

## 2018-03-28 DIAGNOSIS — N189 Chronic kidney disease, unspecified: Secondary | ICD-10-CM | POA: Diagnosis not present

## 2018-03-28 DIAGNOSIS — N184 Chronic kidney disease, stage 4 (severe): Secondary | ICD-10-CM | POA: Diagnosis not present

## 2018-03-28 DIAGNOSIS — N2581 Secondary hyperparathyroidism of renal origin: Secondary | ICD-10-CM | POA: Diagnosis not present

## 2018-03-28 DIAGNOSIS — I129 Hypertensive chronic kidney disease with stage 1 through stage 4 chronic kidney disease, or unspecified chronic kidney disease: Secondary | ICD-10-CM | POA: Diagnosis not present

## 2018-04-04 ENCOUNTER — Encounter: Payer: Self-pay | Admitting: Internal Medicine

## 2018-04-04 ENCOUNTER — Ambulatory Visit (INDEPENDENT_AMBULATORY_CARE_PROVIDER_SITE_OTHER): Payer: 59 | Admitting: Internal Medicine

## 2018-04-04 VITALS — BP 108/70 | HR 75 | Temp 98.0°F | Ht 71.0 in | Wt 168.0 lb

## 2018-04-04 DIAGNOSIS — Z113 Encounter for screening for infections with a predominantly sexual mode of transmission: Secondary | ICD-10-CM | POA: Diagnosis not present

## 2018-04-04 DIAGNOSIS — N183 Chronic kidney disease, stage 3 unspecified: Secondary | ICD-10-CM

## 2018-04-04 DIAGNOSIS — B20 Human immunodeficiency virus [HIV] disease: Secondary | ICD-10-CM | POA: Diagnosis not present

## 2018-04-04 DIAGNOSIS — Z23 Encounter for immunization: Secondary | ICD-10-CM | POA: Diagnosis not present

## 2018-04-04 MED ORDER — BICTEGRAVIR-EMTRICITAB-TENOFOV 50-200-25 MG PO TABS: 1.0000 | ORAL_TABLET | Freq: Every day | ORAL | 11 refills | Status: DC

## 2018-04-04 NOTE — Addendum Note (Signed)
Addended by: Eugenia Mcalpine on: 04/04/2018 04:47 PM   Modules accepted: Orders

## 2018-04-04 NOTE — Assessment & Plan Note (Signed)
Flu and Menveo given today

## 2018-04-04 NOTE — Assessment & Plan Note (Addendum)
Followed by Dr. Jimmy Footman and stable creat Changing to [facility] as below

## 2018-04-04 NOTE — Progress Notes (Signed)
   Subjective:    Patient ID: Chase Rollins, male    DOB: 1981/01/22, 37 y.o.   MRN: [id number]  HPI Here for follow up of HIV. He has remained on Triumeq and denies any recent missed doses.  Has not been here in 1 year.  CD4 though 900 and virus remains < 20. Has a stable partner and no new partners.  No penile discharge, warts.  No missed doses. No new issues.  Followed by Dr. Detereding for renal and follows endocrinology.     Review of Systems  Constitutional: Negative for fatigue.  Gastrointestinal: Negative for nausea.  Skin: Negative for rash.       Objective:   Physical Exam  Constitutional: He appears well-developed and well-nourished. No distress.  HENT:  Mouth/Throat: No oropharyngeal exudate.  Eyes: No scleral icterus.  Cardiovascular: Normal rate, regular rhythm and normal heart sounds.  No murmur heard. Pulmonary/Chest: Effort normal and breath sounds normal. No respiratory distress.  Skin: No rash noted.   SH: no tobacco       Assessment & Plan:

## 2018-04-04 NOTE — Assessment & Plan Note (Signed)
Will change him to [facility] which he can use with low GFR and on HD.   rtc 6 months.

## 2018-04-06 ENCOUNTER — Ambulatory Visit (INDEPENDENT_AMBULATORY_CARE_PROVIDER_SITE_OTHER): Payer: 59 | Admitting: Endocrinology

## 2018-04-06 ENCOUNTER — Encounter: Payer: Self-pay | Admitting: Endocrinology

## 2018-04-06 VITALS — BP 120/76 | HR 84 | Ht 71.0 in | Wt 167.4 lb

## 2018-04-06 DIAGNOSIS — E1022 Type 1 diabetes mellitus with diabetic chronic kidney disease: Secondary | ICD-10-CM

## 2018-04-06 DIAGNOSIS — N183 Chronic kidney disease, stage 3 unspecified: Secondary | ICD-10-CM

## 2018-04-06 LAB — POCT GLYCOSYLATED HEMOGLOBIN (HGB A1C): Hemoglobin A1C: 6.1 % — AB (ref 4.0–5.6)

## 2018-04-06 NOTE — Patient Instructions (Signed)
A different type of diabetes blood test is requested for you today.  We'll let you know about the results. check your blood sugar twice a day.  vary the time of day when you check, between before the 3 meals, and at bedtime.  also check if you have symptoms of your blood sugar being too high or too low.  please keep a record of the readings and bring it to your next appointment here (or you can bring the meter itself).  You can write it on any piece of paper.  please call us sooner if your blood sugar goes below 70, or if you have a lot of readings over 200.   On this type of insulin schedule, you should eat meals on a regular schedule.  If a meal is missed or significantly delayed, your blood sugar could go low.  Please come back for a follow-up appointment in 3 months.

## 2018-04-06 NOTE — Progress Notes (Signed)
Subjective:    Patient ID: Chase Rollins, male    DOB: 1981-05-03, 37 y.o.   MRN: [id number]  HPI Pt returns for f/u of diabetes mellitus: DM type: 1 Dx'ed: 4098 Complications: polyneuropathy, DR, and renal failure.   Therapy: insulin since soon after dx.  DKA: never Severe hypoglycemia: never.  Pancreatitis: never.  Pancreatic imaging: never.  Other: he takes qd insulin, after poor results with more frequent injections.  Interval history: no cbg record, but states cbg's are in the low-100's.  pt states he feels well in general. He takes insulin as rx'ed Past Medical History:  Diagnosis Date  . Diabetes ([place])   . HIV (human immunodeficiency virus infection) ([place])   . HTN (hypertension)     No past surgical history on file.  Social History   Socioeconomic History  . Marital status: Single    Spouse name: Not on file  . Number of children: Not on file  . Years of education: Not on file  . Highest education level: Not on file  Occupational History  . Not on file  Social Needs  . Financial resource strain: Not on file  . Food insecurity:    Worry: Not on file    Inability: Not on file  . Transportation needs:    Medical: Not on file    Non-medical: Not on file  Tobacco Use  . Smoking status: Never Smoker  . Smokeless tobacco: Never Used  Substance and Sexual Activity  . Alcohol use: Yes    Comment: socially  . Drug use: No  . Sexual activity: Yes    Partners: Male    Comment: pt declined condoms  Lifestyle  . Physical activity:    Days per week: Not on file    Minutes per session: Not on file  . Stress: Not on file  Relationships  . Social connections:    Talks on phone: Not on file    Gets together: Not on file    Attends religious service: Not on file    Active member of club or organization: Not on file    Attends meetings of clubs or organizations: Not on file    Relationship status: Not on file  . Intimate partner violence:    Fear of current  or ex partner: Not on file    Emotionally abused: Not on file    Physically abused: Not on file    Forced sexual activity: Not on file  Other Topics Concern  . Not on file  Social History Narrative  . Not on file    Current Outpatient Medications on File Prior to Visit  Medication Sig Dispense Refill  . amLODipine (NORVASC) 5 MG tablet Take by mouth.    Marland Kitchen atorvastatin (LIPITOR) 10 MG tablet Take 1 tablet (10 mg total) by mouth daily at 6 PM. 90 tablet 3  . bictegravir-emtricitabine-tenofovir AF (BIKTARVY) 50-200-25 MG TABS tablet Take 1 tablet by mouth daily. 30 tablet 11  . calcitRIOL (ROCALTROL) 0.25 MCG capsule Take by mouth daily.  6  . Insulin Glargine (BASAGLAR KWIKPEN) 100 UNIT/ML SOPN Inject 0.3 mLs (30 Units total) into the skin every morning. 45 mL 2  . lisinopril (PRINIVIL,ZESTRIL) 40 MG tablet Take 40 mg by mouth at bedtime.  3  . lisinopril-hydrochlorothiazide (PRINZIDE,ZESTORETIC) 20-12.5 MG tablet TAKE 1 TABLET BY MOUTH DAILY. 90 tablet 0   No current facility-administered medications on file prior to visit.     No Known Allergies  Family History  Problem  Relation Age of Onset  . Diabetes Mother   . Diabetes Paternal Grandmother     BP 120/76 (BP Location: Left Arm, Patient Position: Sitting, Cuff Size: Normal)   Pulse 84   Ht 5\' 11"  (1.803 m)   Wt 167 lb 6.4 oz (75.9 kg)   SpO2 98%   BMI 23.35 kg/m    Review of Systems He denies hypoglycemia    Objective:   Physical Exam VITAL SIGNS:  See vs page GENERAL: no distress Pulses: dorsalis pedis intact bilat.   MSK: no deformity of the feet CV: no leg edema Skin:  no ulcer on the feet.  normal color and temp on the feet. Neuro: sensation is intact to touch on the feet  A1c=6.1%  Lab Results  Component Value Date   CREATININE 4.12 (H) 02/21/2018   BUN 36 (H) 02/21/2018   NA 136 02/21/2018   K 5.5 (H) 02/21/2018   CL 108 02/21/2018   CO2 23 02/21/2018        Assessment & Plan:    Insulin-requiring type 2 DM, with DR: possibly overcontrolled. Renal failure: check fructosamine  Patient Instructions  A different type of diabetes blood test is requested for you today.  We'll let you know about the results. check your blood sugar twice a day.  vary the time of day when you check, between before the 3 meals, and at bedtime.  also check if you have symptoms of your blood sugar being too high or too low.  please keep a record of the readings and bring it to your next appointment here (or you can bring the meter itself).  You can write it on any piece of paper.  please call us sooner if your blood sugar goes below 70, or if you have a lot of readings over 200.   On this type of insulin schedule, you should eat meals on a regular schedule.  If a meal is missed or significantly delayed, your blood sugar could go low.  Please come back for a follow-up appointment in 3 months.

## 2018-04-11 LAB — FRUCTOSAMINE: FRUCTOSAMINE: 314 umol/L — AB (ref 205–285)

## 2018-05-03 ENCOUNTER — Encounter: Payer: 59 | Attending: Medical | Admitting: Dietician

## 2018-05-03 ENCOUNTER — Encounter: Payer: Self-pay | Admitting: Dietician

## 2018-05-03 DIAGNOSIS — N183 Chronic kidney disease, stage 3 unspecified: Secondary | ICD-10-CM

## 2018-05-03 DIAGNOSIS — E1022 Type 1 diabetes mellitus with diabetic chronic kidney disease: Secondary | ICD-10-CM | POA: Diagnosis present

## 2018-05-03 NOTE — Patient Instructions (Signed)
[facility] app  Continue to follow a diet low in sodium, phosphorous, and potassium. Please call if you have any questions or concerns.

## 2018-05-03 NOTE — Progress Notes (Signed)
Diabetes Self-Management Education  Visit Type: First/Initial  Appt. Start Time: 1015 Appt. End Time: 1130  05/03/2018  Mr. Chase Rollins, identified by name and date of birth, is a 37 y.o. male with a diagnosis of Diabetes: Type 1.   ASSESSMENT Patient with history of type 1 diabetes since 1997, CKD stage 4, polyneuropathy, HTN, and HIV. Labs noted 02/21/18:  eGFR 20, BUN 36, Creatinine 4.12, Potassium 5.5, Alk Phos 69 and 04/06/18 A1C of 6.1% and Fructosamine 314. Medications include Basaglar 30 units q am.  Patient lives with his partner.  They share shopping but his partner does the cooking.  They also eat out frequently and patient is trying to learn to cook a little bit as he is trying to reduce his sodium intake.  He works for [employer].  Height '5\' 11"'  (1.803 m), weight 170 lb (77.1 kg). Body mass index is 23.71 kg/m.  Diabetes Self-Management Education - 05/03/18 1715      Visit Information   Visit Type  First/Initial      Initial Visit   Diabetes Type  Type 1    Are you currently following a meal plan?  Yes    What type of meal plan do you follow?  low sodium, modified carbohydrate    Are you taking your medications as prescribed?  Yes    Date Diagnosed  1997      Psychosocial Assessment   Self-care barriers  None    Self-management support  Doctor's office;Friends    Other persons present  Patient    Patient Concerns  Nutrition/Meal planning    Special Needs  None    Preferred Learning Style  No preference indicated    Learning Readiness  Ready    How often do you need to have someone help you when you read instructions, pamphlets, or other written materials from your doctor or pharmacy?  1 - Never    What is the last grade level you completed in school?  4 years college      Pre-Education Assessment   Patient understands the diabetes disease and treatment process.  Demonstrates understanding / competency    Patient understands incorporating nutritional  management into lifestyle.  Needs Review    Patient undertands incorporating physical activity into lifestyle.  Demonstrates understanding / competency    Patient understands using medications safely.  Demonstrates understanding / competency    Patient understands monitoring blood glucose, interpreting and using results  Demonstrates understanding / competency    Patient understands prevention, detection, and treatment of acute complications.  Demonstrates understanding / competency    Patient understands prevention, detection, and treatment of chronic complications.  Demonstrates understanding / competency    Patient understands how to develop strategies to address psychosocial issues.  Demonstrates understanding / competency    Patient understands how to develop strategies to promote health/change behavior.  Needs Review      Complications   Last HgB A1C per patient/outside source  6.1 %   04/06/18 with fructosamine 314   How often do you check your blood sugar?  1-2 times/day    Fasting Blood glucose range (mg/dL)  130-179;70-129   100-140   Number of hypoglycemic episodes per month  5    Can you tell when your blood sugar is low?  Yes    What do you do if your blood sugar is low?  juice or gummies    Number of hyperglycemic episodes per week  2    Can you  tell when your blood sugar is high?  Yes    What do you do if your blood sugar is high?  drinks water    Have you had a dilated eye exam in the past 12 months?  Yes    Have you had a dental exam in the past 12 months?  Yes    Are you checking your feet?  Yes    How many days per week are you checking your feet?  7      Dietary Intake   Breakfast  eggs, white toast with jelly or bagel with cream cheese     Lunch  salad or wrap from home on white bread with deli meat, fruit (tries to choose low potassium- apple, applesauce, berries), SF jello    Snack (afternoon)  skinny popcorn    Dinner  pasta, olive oil, raw broccoli, baked  chicken or out to eat (subway, Art therapist and occasional fries OR chipolte)    Snack (evening)  gummies occ if BG is low at night and/or PB crackers    Beverage(s)  water, Bubly sparkling water, diet ocean spray, crystal lite, occasional almond milk, occasional diet ginger ale      Patient Education   Previous Diabetes Education  Yes (please comment)   teen   Nutrition management   Food label reading, portion sizes and measuring food.;Meal options for control of blood glucose level and chronic complications.;Other (comment);Information on hints to eating out and maintain blood glucose control.   diet for CKD (low sodium, low potassium, low phosphorous)   Medications  Reviewed patients medication for diabetes, action, purpose, timing of dose and side effects.    Monitoring  Daily foot exams;Yearly dilated eye exam    Chronic complications  Identified and discussed with patient  current chronic complications;Retinopathy and reason for yearly dilated eye exams      Individualized Goals (developed by patient)   Nutrition  General guidelines for healthy choices and portions discussed;Other (comment)   low potassium, low sodium, low phosphorous, general DM diet   Physical Activity  Exercise 3-5 times per week;30 minutes per day    Medications  take my medication as prescribed    Monitoring   test my blood glucose as discussed    Reducing Risk  examine blood glucose patterns    Health Coping  discuss diabetes with (comment)   MD, RD, CDE     Post-Education Assessment   Patient understands the diabetes disease and treatment process.  Demonstrates understanding / competency    Patient understands incorporating nutritional management into lifestyle.  Demonstrates understanding / competency    Patient undertands incorporating physical activity into lifestyle.  Demonstrates understanding / competency    Patient understands using medications safely.  Demonstrates understanding / competency    Patient  understands monitoring blood glucose, interpreting and using results  Demonstrates understanding / competency    Patient understands prevention, detection, and treatment of acute complications.  Demonstrates understanding / competency    Patient understands prevention, detection, and treatment of chronic complications.  Demonstrates understanding / competency    Patient understands how to develop strategies to address psychosocial issues.  Demonstrates understanding / competency    Patient understands how to develop strategies to promote health/change behavior.  Demonstrates understanding / competency      Outcomes   Expected Outcomes  Demonstrated interest in learning. Expect positive outcomes    Future DMSE  PRN    Program Status  Completed  Individualized Plan for Diabetes Self-Management Training:   Learning Objective:  Patient will have a greater understanding of diabetes self-management. Patient education plan is to attend individual and/or group sessions per assessed needs and concerns.   Plan:   Patient Instructions  [facility] app  Continue to follow a diet low in sodium, phosphorous, and potassium. Please call if you have any questions or concerns.   Expected Outcomes:  Demonstrated interest in learning. Expect positive outcomes  Education material provided: kidney diet pyramid, CKD nutrition therapy from [facility]  If problems or questions, patient to contact team via:  Phone  Future DSME appointment: PRN

## 2018-05-12 ENCOUNTER — Other Ambulatory Visit: Payer: Self-pay

## 2018-05-12 DIAGNOSIS — N184 Chronic kidney disease, stage 4 (severe): Secondary | ICD-10-CM

## 2018-05-13 DIAGNOSIS — N184 Chronic kidney disease, stage 4 (severe): Secondary | ICD-10-CM | POA: Diagnosis not present

## 2018-06-24 ENCOUNTER — Telehealth: Payer: Self-pay | Admitting: *Deleted

## 2018-06-24 NOTE — Telephone Encounter (Signed)
REFERRAL SENT SCHEDULING AND NOTES ON FILE FROM [facility] [phone].

## 2018-06-27 ENCOUNTER — Other Ambulatory Visit: Payer: Self-pay | Admitting: Nephrology

## 2018-06-27 DIAGNOSIS — R935 Abnormal findings on diagnostic imaging of other abdominal regions, including retroperitoneum: Secondary | ICD-10-CM

## 2018-06-28 ENCOUNTER — Encounter: Payer: Self-pay | Admitting: Vascular Surgery

## 2018-06-28 ENCOUNTER — Ambulatory Visit (INDEPENDENT_AMBULATORY_CARE_PROVIDER_SITE_OTHER)
Admission: RE | Admit: 2018-06-28 | Discharge: 2018-06-28 | Disposition: A | Discharge status: Home / Self Care | Payer: BLUE CROSS/BLUE SHIELD | Source: Ambulatory Visit | Attending: Family | Admitting: Family

## 2018-06-28 ENCOUNTER — Encounter: Payer: Self-pay | Admitting: *Deleted

## 2018-06-28 ENCOUNTER — Other Ambulatory Visit: Payer: Self-pay | Admitting: *Deleted

## 2018-06-28 ENCOUNTER — Ambulatory Visit (INDEPENDENT_AMBULATORY_CARE_PROVIDER_SITE_OTHER): Payer: BLUE CROSS/BLUE SHIELD | Admitting: Vascular Surgery

## 2018-06-28 ENCOUNTER — Other Ambulatory Visit: Payer: Self-pay

## 2018-06-28 ENCOUNTER — Ambulatory Visit (HOSPITAL_COMMUNITY)
Admission: RE | Admit: 2018-06-28 | Discharge: 2018-06-28 | Disposition: A | Discharge status: Home / Self Care | Payer: BLUE CROSS/BLUE SHIELD | Source: Ambulatory Visit | Attending: Family | Admitting: Family

## 2018-06-28 VITALS — BP 126/75 | HR 100 | Temp 97.3°F | Resp 18 | Ht 71.0 in | Wt 170.0 lb

## 2018-06-28 DIAGNOSIS — N184 Chronic kidney disease, stage 4 (severe): Secondary | ICD-10-CM

## 2018-06-28 DIAGNOSIS — N185 Chronic kidney disease, stage 5: Secondary | ICD-10-CM

## 2018-06-28 NOTE — Progress Notes (Signed)
Patient name: Randy Castrejon MRN: [id number] DOB: Apr 02, 1981 Sex: male  REASON FOR CONSULT: AV fistula access  HPI: Dorean Tumolo is a 38 y.o. male, with history of diabetes, hypertension, HIV that presents for evaluation for new dialysis access.  Patient states he is left-handed and works as a Landscape architect for [employer].  He travels throughout this region.  He states he has never had any upper extremity access previously.  He denies any previous catheters.  He denies any central pacemakers or other devices.  He would like access in his right arm and states he would like to avoid use of his left arm if possible.  Past Medical History:  Diagnosis Date  . Chronic kidney disease   . Diabetes ([place])   . HIV (human immunodeficiency virus infection) ([place])   . HTN (hypertension)     History reviewed. No pertinent surgical history.  Family History  Problem Relation Age of Onset  . Diabetes Mother   . Diabetes Paternal Grandmother     SOCIAL HISTORY: Social History   Socioeconomic History  . Marital status: Single    Spouse name: Not on file  . Number of children: Not on file  . Years of education: Not on file  . Highest education level: Not on file  Occupational History  . Not on file  Social Needs  . Financial resource strain: Not on file  . Food insecurity:    Worry: Not on file    Inability: Not on file  . Transportation needs:    Medical: Not on file    Non-medical: Not on file  Tobacco Use  . Smoking status: Never Smoker  . Smokeless tobacco: Never Used  Substance and Sexual Activity  . Alcohol use: Yes    Comment: socially  . Drug use: No  . Sexual activity: Yes    Partners: Male    Comment: pt declined condoms  Lifestyle  . Physical activity:    Days per week: Not on file    Minutes per session: Not on file  . Stress: Not on file  Relationships  . Social connections:    Talks on phone: Not on file    Gets together: Not on file    Attends  religious service: Not on file    Active member of club or organization: Not on file    Attends meetings of clubs or organizations: Not on file    Relationship status: Not on file  . Intimate partner violence:    Fear of current or ex partner: Not on file    Emotionally abused: Not on file    Physically abused: Not on file    Forced sexual activity: Not on file  Other Topics Concern  . Not on file  Social History Narrative  . Not on file    No Known Allergies  Current Outpatient Medications  Medication Sig Dispense Refill  . atorvastatin (LIPITOR) 10 MG tablet Take 1 tablet (10 mg total) by mouth daily at 6 PM. 90 tablet 3  . bictegravir-emtricitabine-tenofovir AF (BIKTARVY) 50-200-25 MG TABS tablet Take 1 tablet by mouth daily. 30 tablet 11  . calcitRIOL (ROCALTROL) 0.25 MCG capsule Take by mouth daily.  6  . Insulin Glargine (BASAGLAR KWIKPEN) 100 UNIT/ML SOPN Inject 0.3 mLs (30 Units total) into the skin every morning. 45 mL 2  . lisinopril-hydrochlorothiazide (PRINZIDE,ZESTORETIC) 20-12.5 MG tablet TAKE 1 TABLET BY MOUTH DAILY. 90 tablet 0  . sodium bicarbonate 650 MG tablet 3 (THREE)  TABLET TWO TIMES DAILY     No current facility-administered medications for this visit.     REVIEW OF SYSTEMS:  [X]  denotes positive finding, [ ]  denotes negative finding Cardiac  Comments:  Chest pain or chest pressure:    Shortness of breath upon exertion:    Short of breath when lying flat:    Irregular heart rhythm:        Vascular    Pain in calf, thigh, or hip brought on by ambulation:    Pain in feet at night that wakes you up from your sleep:     Blood clot in your veins:    Leg swelling:         Pulmonary    Oxygen at home:    Productive cough:     Wheezing:         Neurologic    Sudden weakness in arms or legs:     Sudden numbness in arms or legs:     Sudden onset of difficulty speaking or slurred speech:    Temporary loss of vision in one eye:     Problems with  dizziness:         Gastrointestinal    Blood in stool:     Vomited blood:         Genitourinary    Burning when urinating:     Blood in urine:        Psychiatric    Major depression:         Hematologic    Bleeding problems:    Problems with blood clotting too easily:        Skin    Rashes or ulcers:        Constitutional    Fever or chills:      PHYSICAL EXAM: Vitals:   06/28/18 1107  BP: 126/75  Pulse: 100  Resp: 18  Temp: (!) 97.3 F (36.3 C)  SpO2: 97%  Weight: 170 lb (77.1 kg)  Height: 5\' 11"  (1.803 m)    GENERAL: The patient is a well-nourished male, in no acute distress. The vital signs are documented above. CARDIAC: There is a regular rate and rhythm.  VASCULAR:  2+ radial pulse palpable bilateral upper extremities 2+ brachial pulse palpable bilateral upper extremities No tissue loss upper extremities PULMONARY: There is good air exchange bilaterally without wheezing or rales. ABDOMEN: Soft and non-tender with normal pitched bowel sounds.  MUSCULOSKELETAL: There are no major deformities or cyanosis. NEUROLOGIC: No focal weakness or paresthesias are detected. SKIN: There are no ulcers or rashes noted. PSYCHIATRIC: The patient has a normal affect.  DATA:   Independently reviewed his vein mapping and he has small superficial veins in the bilateral upper extremities.  His arteries have triphasic waveforms with no occlusive disease.  Assessment/Plan:  38 year old male with stage V chronic kidney disease that needs permanent dialysis access.  Unfortunately he has relatively small superficial veins in his bilateral upper extremities.  He is left-handed and would like a right arm access and works as a Landscape architect.  In reviewing his vein mapping discussed that he is likely to require graft placement.  That being said, I am going to reevaluate his veins with an ultrasound in the OR and if I can somehow use his right cephalic vein even though it has a marginal  area in the mid upper arm it may be worth trying given his young age of 27.  We will schedule for next available date for  right upper extremity AV fistula versus graft.  Risks and benefits discussed with patient.   Marty Heck, MD [facility] Office: [phone] Pager: [place]

## 2018-07-12 ENCOUNTER — Ambulatory Visit (INDEPENDENT_AMBULATORY_CARE_PROVIDER_SITE_OTHER): Payer: BLUE CROSS/BLUE SHIELD | Admitting: Endocrinology

## 2018-07-12 ENCOUNTER — Other Ambulatory Visit: Payer: Self-pay

## 2018-07-12 ENCOUNTER — Encounter: Payer: Self-pay | Admitting: Endocrinology

## 2018-07-12 VITALS — BP 128/62 | HR 84 | Ht 71.0 in | Wt 171.2 lb

## 2018-07-12 DIAGNOSIS — N183 Chronic kidney disease, stage 3 unspecified: Secondary | ICD-10-CM

## 2018-07-12 DIAGNOSIS — E1022 Type 1 diabetes mellitus with diabetic chronic kidney disease: Secondary | ICD-10-CM

## 2018-07-12 LAB — POCT GLYCOSYLATED HEMOGLOBIN (HGB A1C): Hemoglobin A1C: 6.2 % — AB (ref 4.0–5.6)

## 2018-07-12 MED ORDER — BASAGLAR KWIKPEN 100 UNIT/ML ~~LOC~~ SOPN: 29.0000 [IU] | PEN_INJECTOR | SUBCUTANEOUS | 2 refills | Status: DC

## 2018-07-12 NOTE — Progress Notes (Signed)
Subjective:    Patient ID: Chase Rollins, male    DOB: [birth year], 38 y.o.   MRN: [id number]  HPI Pt returns for f/u of diabetes mellitus: DM type: 1 Dx'ed: 3009 Complications: polyneuropathy, DR, and renal failure.   Therapy: insulin since soon after dx.  DKA: never Severe hypoglycemia: never.  Pancreatitis: never.  Pancreatic imaging: never.  Other: he takes qd insulin, after poor results with more frequent injections; fructosamine converts to a1c approx 1% higher than a1c itself.   Interval history: no cbg record, but states cbg's are in the low-100's.  pt states he feels well in general. He takes insulin as rx'ed.  He has mild hypoglycemia approx 3/week.  Past Medical History:  Diagnosis Date  . Chronic kidney disease   . Diabetes ([place])   . HIV (human immunodeficiency virus infection) ([place])   . HTN (hypertension)     No past surgical history on file.  Social History   Socioeconomic History  . Marital status: Single    Spouse name: Not on file  . Number of children: Not on file  . Years of education: Not on file  . Highest education level: Not on file  Occupational History  . Not on file  Social Needs  . Financial resource strain: Not on file  . Food insecurity:    Worry: Not on file    Inability: Not on file  . Transportation needs:    Medical: Not on file    Non-medical: Not on file  Tobacco Use  . Smoking status: Never Smoker  . Smokeless tobacco: Never Used  Substance and Sexual Activity  . Alcohol use: Yes    Comment: socially  . Drug use: No  . Sexual activity: Yes    Partners: Male    Comment: pt declined condoms  Lifestyle  . Physical activity:    Days per week: Not on file    Minutes per session: Not on file  . Stress: Not on file  Relationships  . Social connections:    Talks on phone: Not on file    Gets together: Not on file    Attends religious service: Not on file    Active member of club or organization: Not on file    Attends  meetings of clubs or organizations: Not on file    Relationship status: Not on file  . Intimate partner violence:    Fear of current or ex partner: Not on file    Emotionally abused: Not on file    Physically abused: Not on file    Forced sexual activity: Not on file  Other Topics Concern  . Not on file  Social History Narrative  . Not on file    Current Outpatient Medications on File Prior to Visit  Medication Sig Dispense Refill  . atorvastatin (LIPITOR) 10 MG tablet Take 1 tablet (10 mg total) by mouth daily at 6 PM. 90 tablet 3  . bictegravir-emtricitabine-tenofovir AF (BIKTARVY) 50-200-25 MG TABS tablet Take 1 tablet by mouth daily. 30 tablet 11  . calcitRIOL (ROCALTROL) 0.25 MCG capsule Take by mouth daily.  6  . lisinopril-hydrochlorothiazide (PRINZIDE,ZESTORETIC) 20-12.5 MG tablet TAKE 1 TABLET BY MOUTH DAILY. 90 tablet 0  . sodium bicarbonate 650 MG tablet 3 (THREE) TABLET TWO TIMES DAILY     No current facility-administered medications on file prior to visit.     No Known Allergies  Family History  Problem Relation Age of Onset  . Diabetes Mother   .  Diabetes Paternal Grandmother     BP 128/62 (BP Location: Right Arm, Patient Position: Sitting, Cuff Size: Normal)   Pulse 84   Ht 5\' 11"  (1.803 m)   Wt 171 lb 3.2 oz (77.7 kg)   SpO2 98%   BMI 23.88 kg/m    Review of Systems Denies LOC    Objective:   Physical Exam VITAL SIGNS:  See vs page GENERAL: no distress Pulses: dorsalis pedis intact bilat.   MSK: no deformity of the feet CV: no leg edema Skin:  no ulcer on the feet.  normal color and temp on the feet. Neuro: sensation is intact to touch on the feet   Lab Results  Component Value Date   HGBA1C 6.2 (A) 07/12/2018       Assessment & Plan:  Type 1 DM: overcontrolled, given this insulin regimen, which does match insulin to his changing needs throughout the day.   Hypoglycemia: This limits aggressiveness of glycemic control.   Renal failure:  a1c underestimates glycemic control.    Patient Instructions  check your blood sugar twice a day.  vary the time of day when you check, between before the 3 meals, and at bedtime.  also check if you have symptoms of your blood sugar being too high or too low.  please keep a record of the readings and bring it to your next appointment here (or you can bring the meter itself).  You can write it on any piece of paper.  please call us sooner if your blood sugar goes below 70, or if you have a lot of readings over 200.   Please reduce the insulin to 29 units each morning.   On this type of insulin schedule, you should eat meals on a regular schedule.  If a meal is missed or significantly delayed, your blood sugar could go low.  Please come back for a follow-up appointment in 3-4 months.

## 2018-07-12 NOTE — Patient Instructions (Addendum)
check your blood sugar twice a day.  vary the time of day when you check, between before the 3 meals, and at bedtime.  also check if you have symptoms of your blood sugar being too high or too low.  please keep a record of the readings and bring it to your next appointment here (or you can bring the meter itself).  You can write it on any piece of paper.  please call us sooner if your blood sugar goes below 70, or if you have a lot of readings over 200.   Please reduce the insulin to 29 units each morning.   On this type of insulin schedule, you should eat meals on a regular schedule.  If a meal is missed or significantly delayed, your blood sugar could go low.  Please come back for a follow-up appointment in 3-4 months.

## 2018-07-14 ENCOUNTER — Encounter (HOSPITAL_COMMUNITY): Payer: Self-pay

## 2018-07-14 NOTE — Progress Notes (Signed)
Spoke to patient on the phone and gave pre-op instructions. Patient informed me that he may be rescheduling his surgery. I informed him to call Dr. Anell Barr office directly to reschedule. Patient verbalized understanding of instructions if he does not move surgery from 07/18/18.

## 2018-07-21 ENCOUNTER — Other Ambulatory Visit: Payer: BLUE CROSS/BLUE SHIELD

## 2018-07-22 ENCOUNTER — Other Ambulatory Visit: Payer: BLUE CROSS/BLUE SHIELD

## 2018-07-22 ENCOUNTER — Other Ambulatory Visit: Payer: Self-pay | Admitting: Nephrology

## 2018-07-26 ENCOUNTER — Ambulatory Visit
Admission: RE | Admit: 2018-07-26 | Discharge: 2018-07-26 | Disposition: A | Discharge status: Home / Self Care | Payer: BLUE CROSS/BLUE SHIELD | Source: Ambulatory Visit | Attending: Nephrology | Admitting: Nephrology

## 2018-07-26 ENCOUNTER — Other Ambulatory Visit: Payer: Self-pay | Admitting: Nephrology

## 2018-07-26 DIAGNOSIS — R935 Abnormal findings on diagnostic imaging of other abdominal regions, including retroperitoneum: Secondary | ICD-10-CM

## 2018-09-11 ENCOUNTER — Other Ambulatory Visit: Payer: Self-pay | Admitting: Medical

## 2018-09-12 ENCOUNTER — Telehealth: Payer: Self-pay | Admitting: Medical

## 2018-09-12 NOTE — Telephone Encounter (Signed)
Is this ok to refill?  

## 2018-09-12 NOTE — Telephone Encounter (Signed)
Schedule for May 2020 CPX with fasting labs.   I sent refill on cholesterol medication, but he is due for yearly physical

## 2018-09-16 NOTE — Telephone Encounter (Signed)
Called and left message for pt

## 2018-09-29 ENCOUNTER — Other Ambulatory Visit: Payer: Self-pay | Admitting: Vascular Surgery

## 2018-10-03 ENCOUNTER — Other Ambulatory Visit: Payer: Self-pay

## 2018-10-03 ENCOUNTER — Other Ambulatory Visit: Payer: BC Managed Care – PPO

## 2018-10-03 DIAGNOSIS — Z113 Encounter for screening for infections with a predominantly sexual mode of transmission: Secondary | ICD-10-CM

## 2018-10-03 DIAGNOSIS — B20 Human immunodeficiency virus [HIV] disease: Secondary | ICD-10-CM

## 2018-10-04 LAB — T-HELPER CELL (CD4) - (RCID CLINIC ONLY)
CD4 % Helper T Cell: 46 % (ref 33–65)
CD4 T Cell Abs: 703 /uL (ref 400–1790)

## 2018-10-08 LAB — HIV-1 RNA QUANT-NO REFLEX-BLD
HIV 1 RNA Quant: 27 copies/mL — ABNORMAL HIGH
HIV-1 RNA Quant, Log: 1.43 Log copies/mL — ABNORMAL HIGH

## 2018-10-08 LAB — RPR: RPR Ser Ql: NONREACTIVE

## 2018-10-17 ENCOUNTER — Telehealth: Payer: Self-pay | Admitting: *Deleted

## 2018-10-17 NOTE — Telephone Encounter (Signed)
REFERRAL SENT SCHEDULING AND NOTES ON FILE FROM  [facility] [phone]

## 2018-10-18 NOTE — Progress Notes (Signed)
[id number], [place] [address] Phone: [phone] Fax: [phone]  [facility] [place] [place] [address] [place] [address] Phone: [phone] Fax: [phone]  [phone] [place], [place] [address] Phone: [phone] Fax: [phone]      Your procedure is scheduled on June 5  Report to [facility] Main Entrance "A" at 0530 A.M., and check in at the Admitting office.  Call this number if you have problems the morning of surgery:  [phone]  Call [phone] if you have any questions prior to your surgery date Monday-Friday 8am-4pm    Remember:  Do not eat or drink after midnight.   Take these medicines the morning of surgery with A SIP OF WATER  bictegravir-emtricitabine-tenofovir AF (BIKTARVY)   WHAT DO I DO ABOUT MY DIABETES MEDICATION?   Marland Kitchen Do not take oral diabetes medicines (pills) the morning of surgery.       . THE MORNING OF SURGERY, take _____14________ units of __Insulin Glargine (BASAGLAR KWIKPEN)________insulin.  . The day of surgery, do not take other diabetes injectables, including Byetta (exenatide), Bydureon (exenatide ER), Victoza (liraglutide), or Trulicity (dulaglutide).  . If your CBG is greater than 220 mg/dL, you may take  of your sliding scale (correction) dose of insulin.   How to Manage Your Diabetes Before and After Surgery  Why is it important to control my blood sugar before and after surgery? . Improving blood sugar levels before and after surgery helps healing and can limit problems. . A way of improving blood sugar control is eating a healthy diet by: o  Eating less sugar and carbohydrates o  Increasing activity/exercise o  Talking with your doctor about reaching your blood sugar goals . High blood sugars (greater than 180 mg/dL) can raise your risk  of infections and slow your recovery, so you will need to focus on controlling your diabetes during the weeks before surgery. . Make sure that the doctor who takes care of your diabetes knows about your planned surgery including the date and location.  How do I manage my blood sugar before surgery? . Check your blood sugar at least 4 times a day, starting 2 days before surgery, to make sure that the level is not too high or low. o Check your blood sugar the morning of your surgery when you wake up and every 2 hours until you get to the Short Stay unit. . If your blood sugar is less than 70 mg/dL, you will need to treat for low blood sugar: o Do not take insulin. o Treat a low blood sugar (less than 70 mg/dL) with  cup of clear juice (cranberry or apple), 4 glucose tablets, OR glucose gel. o Recheck blood sugar in 15 minutes after treatment (to make sure it is greater than 70 mg/dL). If your blood sugar is not greater than 70 mg/dL on recheck, call [phone] for further instructions. . Report your blood sugar to the short stay nurse when you get to Short Stay.  . If you are admitted to the hospital after surgery: o Your blood sugar will be checked by the staff and you will probably be given insulin after surgery (instead of oral diabetes medicines) to make sure you have good blood sugar levels. o The goal for blood sugar control after surgery is 80-180 mg/dL.  7 days prior to surgery STOP taking any Aspirin (unless otherwise instructed by your surgeon), Aleve, Naproxen, Ibuprofen, Motrin, Advil, Goody's, BC's, all herbal medications, fish oil, and all vitamins.    The Morning of Surgery  Do not wear jewelry, make-up or nail polish.  Do not wear lotions, powders, or perfumes/colognes, or deodorant  Do not shave 48 hours prior to surgery.  Men may shave face and neck.  Do not bring valuables to the hospital.  [facility] is not responsible for any belongings or valuables.  If you are a  smoker, DO NOT Smoke 24 hours prior to surgery IF you wear a CPAP at night please bring your mask, tubing, and machine the morning of surgery   Remember that you must have someone to transport you home after your surgery, and remain with you for 24 hours if you are discharged the same day.   Contacts, glasses, hearing aids, dentures or bridgework may not be worn into surgery.    Leave your suitcase in the car.  After surgery it may be brought to your room.  For patients admitted to the hospital, discharge time will be determined by your treatment team.  Patients discharged the day of surgery will not be allowed to drive home.    Special instructions:   - Preparing For Surgery  Before surgery, you can play an important role. Because skin is not sterile, your skin needs to be as free of germs as possible. You can reduce the number of germs on your skin by washing with CHG (chlorahexidine gluconate) Soap before surgery.  CHG is an antiseptic cleaner which kills germs and bonds with the skin to continue killing germs even after washing.    Oral Hygiene is also important to reduce your risk of infection.  Remember - BRUSH YOUR TEETH THE MORNING OF SURGERY WITH YOUR REGULAR TOOTHPASTE  Please do not use if you have an allergy to CHG or antibacterial soaps. If your skin becomes reddened/irritated stop using the CHG.  Do not shave (including legs and underarms) for at least 48 hours prior to first CHG shower. It is OK to shave your face.  Please follow these instructions carefully.   1. Shower the NIGHT BEFORE SURGERY and the MORNING OF SURGERY with CHG Soap.   2. If you chose to wash your hair, wash your hair first as usual with your normal shampoo.  3. After you shampoo, rinse your hair and body thoroughly to remove the shampoo.  4. Use CHG as you would any other liquid soap. You can apply CHG directly to the skin and wash gently with a scrungie or a clean washcloth.    5. Apply the CHG Soap to your body ONLY FROM THE NECK DOWN.  Do not use on open wounds or open sores. Avoid contact with your eyes, ears, mouth and genitals (private parts). Wash Face and genitals (private parts)  with your normal soap.   6. Wash thoroughly, paying special attention to the area where your surgery will be performed.  7. Thoroughly rinse your body with warm water from the neck down.  8. DO NOT shower/wash with your normal soap after using and rinsing off the CHG Soap.  9. Pat yourself dry with a CLEAN TOWEL.  10. Wear CLEAN PAJAMAS to bed the night before surgery, wear comfortable clothes the morning of surgery  11. Place CLEAN SHEETS on your bed the night of your first shower and DO NOT SLEEP WITH PETS.  Day of Surgery:  Do not apply any deodorants/lotions.  Please wear clean clothes to the hospital/surgery center.   Remember to brush your teeth WITH YOUR REGULAR TOOTHPASTE.   Please read over the following fact sheets that you were given.

## 2018-10-19 ENCOUNTER — Inpatient Hospital Stay (HOSPITAL_COMMUNITY)
Admission: RE | Admit: 2018-10-19 | Discharge: 2018-10-19 | Disposition: A | Discharge status: Home / Self Care | Payer: BLUE CROSS/BLUE SHIELD | Source: Ambulatory Visit

## 2018-10-20 ENCOUNTER — Inpatient Hospital Stay (HOSPITAL_COMMUNITY): Admission: RE | Admit: 2018-10-20 | Payer: BLUE CROSS/BLUE SHIELD | Source: Ambulatory Visit

## 2018-10-21 ENCOUNTER — Ambulatory Visit (HOSPITAL_COMMUNITY)
Admission: RE | Admit: 2018-10-21 | Payer: BC Managed Care – PPO | Source: Home / Self Care | Admitting: Vascular Surgery

## 2018-10-21 SURGERY — ARTERIOVENOUS (AV) FISTULA CREATION
Anesthesia: Monitor Anesthesia Care | Laterality: Right

## 2018-10-26 ENCOUNTER — Encounter: Payer: Self-pay | Admitting: *Deleted

## 2018-10-26 NOTE — Progress Notes (Signed)
Cardiology Office Note:    Date:  10/27/2018   ID:  Chase Rollins, DOB 1981-04-29, MRN [id number]  PCP:  Carlena Hurl, PA-C  Cardiologist:  Shirlee More, MD   Referring MD: Carlena Hurl, PA-C/ [facility]  ASSESSMENT:    1. Essential hypertension   2. Hyperlipidemia, unspecified hyperlipidemia type   3. Stage 5 chronic kidney disease not on chronic dialysis ([place])   4. HIV disease ([place])   5. Type 1 diabetes mellitus with stage 3 chronic kidney disease ([facility])    PLAN:    In order of problems listed above:  1. Stable hypertension associated with CKD BP at target on ACE inhibitor for renal protection 2. Hyperlipidemia stable he is on a statin 3. CKD stable managed by nephrology he is initiate a renal transplantation evaluation 4. Stable 5. Stable managed by his PCP and endocrinology on insulin 6. Heart murmur at high risk for underlying heart disease echocardiogram is appropriate ordered at this time I do not think he requires an ischemia evaluation endocarditis prophylaxis  Next appointment 6 months or sooner if necessary pulmonary transplant evaluation   Medication Adjustments/Labs and Tests Ordered: Current medicines are reviewed at length with the patient today.  Concerns regarding medicines are outlined above.  No orders of the defined types were placed in this encounter.  No orders of the defined types were placed in this encounter.    Chief Complaint  Patient presents with  . Heart Murmur    History of Present Illness:    Chase Rollins is a 38 y.o. male who is being seen today for the evaluation of  at the request of Dr. Jimmy Footman of [facility].   PMH HTN, DM1 since 1997, CKDIV with plan for fistula/graft placement by [facility], HIV, HLD. He is followed by his PCP, nephrology, endocrinology, and vascular surgery. He has been referred for kidney transplant to [facility] with workup delayed due to [place].  Last seen by Dr. Jimmy Footman 10/17/18 with note of  holosytolic I/VI murmur at the apex and additional note that amlodipine may need to be increased for improved BP control.   10/17/18 Hb 10.9, creatinine 3.85, GFR 22, K 4.7, Ca 9.3, AST 14, ALT 14, Ph 4.3  He is seen by me today at the request of nephrology as part of his transplant evaluation.  He has no known heart disease and is not having cardiovascular symptoms of chest pain shortness of breath edema palpitations syncope or exercise intolerance.  His CKD is stable and plans for access have been delayed.  He is initiated process of transplantation at [facility] but no specific recommendations for cardiac evaluation.  He was noted to have a murmur at his last visit with nephrology I cannot auscultate a heart murmur today. Past Medical History:  Diagnosis Date  . Anemia   . Anemia associated with chronic renal failure   . Chronic kidney disease    Stage IV  . Diabetes ([place])   . HIV (human immunodeficiency virus infection) ([place])   . HTN (hypertension)     Past Surgical History:  Procedure Laterality Date  . NO PAST SURGERIES      Current Medications: Current Meds  Medication Sig  . amLODipine (NORVASC) 5 MG tablet Take 5 mg by mouth at bedtime.  Marland Kitchen atorvastatin (LIPITOR) 10 MG tablet TAKE 1 TABLET (10 MG TOTAL) BY MOUTH DAILY AT 6 PM.  . bictegravir-emtricitabine-tenofovir AF (BIKTARVY) 50-200-25 MG TABS tablet Take 1 tablet by mouth daily.  Marland Kitchen  calcitRIOL (ROCALTROL) 0.25 MCG capsule Take by mouth every Monday, Wednesday, and Friday.   . Insulin Glargine (BASAGLAR KWIKPEN) 100 UNIT/ML SOPN Inject 0.29 mLs (29 Units total) into the skin every morning.  Marland Kitchen lisinopril (ZESTRIL) 40 MG tablet Take 40 mg by mouth at bedtime.  Marland Kitchen lisinopril-hydrochlorothiazide (PRINZIDE,ZESTORETIC) 20-12.5 MG tablet TAKE 1 TABLET BY MOUTH DAILY.  . sodium bicarbonate 650 MG tablet 3 (THREE) TABLET TWO TIMES DAILY     Allergies:   Patient has no known allergies.   Social History   Socioeconomic History  . Marital  status: Single    Spouse name: Not on file  . Number of children: Not on file  . Years of education: Not on file  . Highest education level: Not on file  Occupational History  . Not on file  Social Needs  . Financial resource strain: Not on file  . Food insecurity    Worry: Not on file    Inability: Not on file  . Transportation needs    Medical: Not on file    Non-medical: Not on file  Tobacco Use  . Smoking status: Never Smoker  . Smokeless tobacco: Never Used  Substance and Sexual Activity  . Alcohol use: Yes    Comment: socially  . Drug use: No  . Sexual activity: Yes    Partners: Male    Comment: pt declined condoms  Lifestyle  . Physical activity    Days per week: Not on file    Minutes per session: Not on file  . Stress: Not on file  Relationships  . Social Herbalist on phone: Not on file    Gets together: Not on file    Attends religious service: Not on file    Active member of club or organization: Not on file    Attends meetings of clubs or organizations: Not on file    Relationship status: Not on file  Other Topics Concern  . Not on file  Social History Narrative  . Not on file     Family History: The patient's family history includes Diabetes in his mother and paternal grandmother; Heart Problems in his paternal aunt; Hypertension in his father and mother.  ROS:   Review of Systems  Constitution: Negative.  HENT: Negative.   Eyes: Negative.   Cardiovascular: Negative.   Respiratory: Negative.   Endocrine: Negative.   Hematologic/Lymphatic: Negative.   Skin: Negative.   Musculoskeletal: Negative.   Gastrointestinal: Negative.   Genitourinary: Negative.   Neurological: Negative.   Psychiatric/Behavioral: Negative.   Allergic/Immunologic: Negative.    Please see the history of present illness.     All other systems reviewed and are negative.  EKGs/Labs/Other Studies Reviewed:    The following studies were reviewed today: None  available for review.   EKG:  EKG is  ordered today.  The ekg ordered today is personally reviewed and demonstrates [facility] and normal EKG  Recent Labs: 02/21/2018: ALT 18; BUN 36; Creat 4.12; Potassium 5.5; Sodium 136  Recent Lipid Panel    Component Value Date/Time   CHOL 135 09/27/2017 1002   TRIG 91 09/27/2017 1002   HDL 40 09/27/2017 1002   CHOLHDL 3.4 09/27/2017 1002   CHOLHDL 4.3 04/02/2016 1434   VLDL 24 04/02/2016 1434   LDLCALC 77 09/27/2017 1002    Physical Exam:    VS:  BP 126/82 (BP Location: Left Arm, Patient Position: Sitting, Cuff Size: Normal)   Pulse 89   Temp 99  F (37.2 C)   Ht 5\' 11"  (1.803 m)   Wt 174 lb 1.9 oz (79 kg)   SpO2 100%   BMI 24.28 kg/m     Wt Readings from Last 3 Encounters:  10/27/18 174 lb 1.9 oz (79 kg)  07/12/18 171 lb 3.2 oz (77.7 kg)  06/28/18 170 lb (77.1 kg)     GEN:  Well nourished, well developed in no acute distress HEENT: Normal NECK: No JVD; No carotid bruits LYMPHATICS: No lymphadenopathy CARDIAC: RRR, no murmurs, rubs, gallops RESPIRATORY:  Clear to auscultation without rales, wheezing or rhonchi  ABDOMEN: Soft, non-tender, non-distended MUSCULOSKELETAL:  No edema; No deformity  SKIN: Warm and dry NEUROLOGIC:  Alert and oriented x 3 PSYCHIATRIC:  Normal affect     Signed, Shirlee More, MD  10/27/2018 9:48 AM    [place]

## 2018-10-27 ENCOUNTER — Encounter: Payer: Self-pay | Admitting: Cardiology

## 2018-10-27 ENCOUNTER — Other Ambulatory Visit: Payer: Self-pay

## 2018-10-27 ENCOUNTER — Ambulatory Visit (INDEPENDENT_AMBULATORY_CARE_PROVIDER_SITE_OTHER): Payer: BC Managed Care – PPO | Admitting: Cardiology

## 2018-10-27 VITALS — BP 126/82 | HR 89 | Temp 99.0°F | Ht 71.0 in | Wt 174.1 lb

## 2018-10-27 DIAGNOSIS — I10 Essential (primary) hypertension: Secondary | ICD-10-CM

## 2018-10-27 DIAGNOSIS — N185 Chronic kidney disease, stage 5: Secondary | ICD-10-CM

## 2018-10-27 DIAGNOSIS — E1022 Type 1 diabetes mellitus with diabetic chronic kidney disease: Secondary | ICD-10-CM

## 2018-10-27 DIAGNOSIS — N183 Chronic kidney disease, stage 3 unspecified: Secondary | ICD-10-CM

## 2018-10-27 DIAGNOSIS — E785 Hyperlipidemia, unspecified: Secondary | ICD-10-CM | POA: Diagnosis not present

## 2018-10-27 DIAGNOSIS — B20 Human immunodeficiency virus [HIV] disease: Secondary | ICD-10-CM | POA: Diagnosis not present

## 2018-10-27 NOTE — Patient Instructions (Signed)
Medication Instructions:  Your physician recommends that you continue on your current medications as directed. Please refer to the Current Medication list given to you today.  If you need a refill on your cardiac medications before your next appointment, please call your pharmacy.   Lab work: None  If you have labs (blood work) drawn today and your tests are completely normal, you will receive your results only by: Marland Kitchen MyChart Message (if you have MyChart) OR . A paper copy in the mail If you have any lab test that is abnormal or we need to change your treatment, we will call you to review the results.  Testing/Procedures: You had an EKG today.   Your physician has requested that you have an echocardiogram. Echocardiography is a painless test that uses sound waves to create images of your heart. It provides your doctor with information about the size and shape of your heart and how well your heart's chambers and valves are working. This procedure takes approximately one hour. There are no restrictions for this procedure.   Follow-Up: At [facility], you and your health needs are our priority.  As part of our continuing mission to provide you with exceptional heart care, we have created designated Provider Care Teams.  These Care Teams include your primary Cardiologist (physician) and Advanced Practice Providers (APPs -  Physician Assistants and Nurse Practitioners) who all work together to provide you with the care you need, when you need it. You will need a follow up appointment in 6 months.      Echocardiogram An echocardiogram is a procedure that uses painless sound waves (ultrasound) to produce an image of the heart. Images from an echocardiogram can provide important information about:  Signs of coronary artery disease (CAD).  Aneurysm detection. An aneurysm is a weak or damaged part of an artery wall that bulges out from the normal force of blood pumping through the body.   Heart size and shape. Changes in the size or shape of the heart can be associated with certain conditions, including heart failure, aneurysm, and CAD.  Heart muscle function.  Heart valve function.  Signs of a past heart attack.  Fluid buildup around the heart.  Thickening of the heart muscle.  A tumor or infectious growth around the heart valves. Tell a health care provider about:  Any allergies you have.  All medicines you are taking, including vitamins, herbs, eye drops, creams, and over-the-counter medicines.  Any blood disorders you have.  Any surgeries you have had.  Any medical conditions you have.  Whether you are pregnant or may be pregnant. What are the risks? Generally, this is a safe procedure. However, problems may occur, including:  Allergic reaction to dye (contrast) that may be used during the procedure. What happens before the procedure? No specific preparation is needed. You may eat and drink normally. What happens during the procedure?   An IV tube may be inserted into one of your veins.  You may receive contrast through this tube. A contrast is an injection that improves the quality of the pictures from your heart.  A gel will be applied to your chest.  A wand-like tool (transducer) will be moved over your chest. The gel will help to transmit the sound waves from the transducer.  The sound waves will harmlessly bounce off of your heart to allow the heart images to be captured in real-time motion. The images will be recorded on a computer. The procedure may vary among health  care providers and hospitals. What happens after the procedure?  You may return to your normal, everyday life, including diet, activities, and medicines, unless your health care provider tells you not to do that. Summary  An echocardiogram is a procedure that uses painless sound waves (ultrasound) to produce an image of the heart.  Images from an echocardiogram can provide  important information about the size and shape of your heart, heart muscle function, heart valve function, and fluid buildup around your heart.  You do not need to do anything to prepare before this procedure. You may eat and drink normally.  After the echocardiogram is completed, you may return to your normal, everyday life, unless your health care provider tells you not to do that. This information is not intended to replace advice given to you by your health care provider. Make sure you discuss any questions you have with your health care provider. Document Released: 05/01/2000 Document Revised: 06/06/2016 Document Reviewed: 06/06/2016 Elsevier Interactive Patient Education  2019 [employer].

## 2018-11-01 ENCOUNTER — Ambulatory Visit (INDEPENDENT_AMBULATORY_CARE_PROVIDER_SITE_OTHER): Payer: BC Managed Care – PPO | Admitting: Internal Medicine

## 2018-11-01 ENCOUNTER — Other Ambulatory Visit: Payer: Self-pay

## 2018-11-01 ENCOUNTER — Encounter: Payer: Self-pay | Admitting: Internal Medicine

## 2018-11-01 VITALS — BP 146/75 | HR 84 | Temp 98.2°F | Wt 176.0 lb

## 2018-11-01 DIAGNOSIS — B20 Human immunodeficiency virus [HIV] disease: Secondary | ICD-10-CM | POA: Diagnosis not present

## 2018-11-01 DIAGNOSIS — Z113 Encounter for screening for infections with a predominantly sexual mode of transmission: Secondary | ICD-10-CM

## 2018-11-01 NOTE — Assessment & Plan Note (Signed)
Doing well, no changes.  rtc 6 months.

## 2018-11-01 NOTE — Assessment & Plan Note (Signed)
Screened negative 

## 2018-11-01 NOTE — Progress Notes (Signed)
   Subjective:    Patient ID: Chase Rollins, male    DOB: [birth year], 38 y.o.   MRN: [id number]  HPI Here for follow up of HIV. He has remained on Triumeq and denies any recent missed doses.  CD4 703 and virus is just 27 copies. Has a stable partner and no new partners.   Review of Systems  Constitutional: Negative for fatigue.  Gastrointestinal: Negative for nausea.  Skin: Negative for rash.       Objective:   Physical Exam  Constitutional: He appears well-developed and well-nourished. No distress.  HENT:  Mouth/Throat: No oropharyngeal exudate.  Eyes: No scleral icterus.  Cardiovascular: Normal rate, regular rhythm and normal heart sounds.  No murmur heard. Pulmonary/Chest: Effort normal and breath sounds normal. No respiratory distress.  Skin: No rash noted.   SH: no tobacco       Assessment & Plan:

## 2018-11-04 ENCOUNTER — Ambulatory Visit (HOSPITAL_BASED_OUTPATIENT_CLINIC_OR_DEPARTMENT_OTHER): Payer: BC Managed Care – PPO | Attending: Cardiology

## 2018-11-14 ENCOUNTER — Other Ambulatory Visit: Payer: Self-pay

## 2018-11-15 ENCOUNTER — Ambulatory Visit (HOSPITAL_BASED_OUTPATIENT_CLINIC_OR_DEPARTMENT_OTHER): Payer: BC Managed Care – PPO

## 2018-11-15 ENCOUNTER — Ambulatory Visit: Payer: BLUE CROSS/BLUE SHIELD | Admitting: Endocrinology

## 2018-11-23 ENCOUNTER — Ambulatory Visit (HOSPITAL_BASED_OUTPATIENT_CLINIC_OR_DEPARTMENT_OTHER)
Admission: RE | Admit: 2018-11-23 | Discharge: 2018-11-23 | Disposition: A | Discharge status: Home / Self Care | Payer: BC Managed Care – PPO | Source: Ambulatory Visit | Attending: Cardiology | Admitting: Cardiology

## 2018-11-23 ENCOUNTER — Other Ambulatory Visit: Payer: Self-pay

## 2018-11-23 DIAGNOSIS — I10 Essential (primary) hypertension: Secondary | ICD-10-CM | POA: Insufficient documentation

## 2018-11-23 NOTE — Progress Notes (Signed)
  Echocardiogram 2D Echocardiogram has been performed.  Chase Rollins 11/23/2018, 3:47 PM

## 2018-12-01 ENCOUNTER — Other Ambulatory Visit (HOSPITAL_COMMUNITY): Payer: Self-pay | Admitting: Nephrology

## 2018-12-07 ENCOUNTER — Other Ambulatory Visit: Payer: Self-pay | Admitting: Medical

## 2018-12-07 NOTE — Telephone Encounter (Signed)
Is this ok to refill?  

## 2018-12-07 NOTE — Telephone Encounter (Signed)
Needs CPX, fasting.  Give #30 days and then get him in

## 2018-12-09 NOTE — Telephone Encounter (Signed)
Left message on voicemail for patient to call back and schedule CPE.   

## 2018-12-21 ENCOUNTER — Other Ambulatory Visit: Payer: Self-pay

## 2018-12-21 DIAGNOSIS — Z20822 Contact with and (suspected) exposure to covid-19: Secondary | ICD-10-CM

## 2018-12-22 LAB — NOVEL CORONAVIRUS, NAA: SARS-CoV-2, NAA: NOT DETECTED

## 2019-01-15 ENCOUNTER — Other Ambulatory Visit: Payer: Self-pay | Admitting: Medical

## 2019-02-03 ENCOUNTER — Other Ambulatory Visit: Payer: Self-pay | Admitting: Medical

## 2019-03-22 ENCOUNTER — Other Ambulatory Visit: Payer: Self-pay | Admitting: Internal Medicine

## 2019-03-22 DIAGNOSIS — B20 Human immunodeficiency virus [HIV] disease: Secondary | ICD-10-CM

## 2019-04-19 ENCOUNTER — Other Ambulatory Visit: Payer: BC Managed Care – PPO

## 2019-04-19 ENCOUNTER — Other Ambulatory Visit: Payer: Self-pay

## 2019-04-19 DIAGNOSIS — B20 Human immunodeficiency virus [HIV] disease: Secondary | ICD-10-CM

## 2019-04-20 LAB — T-HELPER CELL (CD4) - (RCID CLINIC ONLY)
CD4 % Helper T Cell: 46 % (ref 33–65)
CD4 T Cell Abs: 873 /uL (ref 400–1790)

## 2019-04-25 LAB — HIV-1 RNA QUANT-NO REFLEX-BLD
HIV 1 RNA Quant: <20 copies/mL — AB
HIV-1 RNA Quant, Log: <1.3 Log copies/mL — AB

## 2019-05-01 ENCOUNTER — Ambulatory Visit: Payer: BC Managed Care – PPO | Admitting: Cardiology

## 2019-05-03 ENCOUNTER — Other Ambulatory Visit: Payer: Self-pay

## 2019-05-03 ENCOUNTER — Ambulatory Visit: Payer: BC Managed Care – PPO | Admitting: Endocrinology

## 2019-05-03 ENCOUNTER — Encounter: Payer: BC Managed Care – PPO | Admitting: Internal Medicine

## 2019-05-08 ENCOUNTER — Ambulatory Visit: Payer: BC Managed Care – PPO | Admitting: Cardiology

## 2019-05-16 ENCOUNTER — Telehealth: Payer: Self-pay

## 2019-05-16 NOTE — Telephone Encounter (Signed)
Pt. Called wanting to schedule an apt. Because he needs some refills on his medications, I got him scheduled for a med check on 05/22/19, pt. Needs a refill on Lipitor and [facility]aglar kwikpen [facility] [place] [facility] [address]

## 2019-05-16 NOTE — Telephone Encounter (Signed)
Please check phone records but I believe we have been trying to get him back in sooner for weeks.  He sees endocrinology Dr. Loanne Drilling so they need to fill his diabetes medicine.  He can call their office about this or have the pharmacy contact Dr. Loanne Drilling.  I would try to work him in sooner than that date

## 2019-05-16 NOTE — Telephone Encounter (Signed)
Chase Rollins looks like pt. Was called back on 09/12/18 to get scheduled but they had to LM. I tried to get him to come in tomorrow he said he couldn't so I got him scheduled for the next available day which is 05/22/19. I will inform him to call Endocrine for a refill on the insulin, did you want me to let him know that the Lipitor will not be filled until he comes in for the apt. Or did you want to send some in for him now.

## 2019-05-17 ENCOUNTER — Other Ambulatory Visit: Payer: Self-pay | Admitting: Medical

## 2019-05-17 MED ORDER — ATORVASTATIN CALCIUM 10 MG PO TABS: 10.0000 mg | ORAL_TABLET | Freq: Every day | ORAL | 0 refills | Status: DC

## 2019-05-17 NOTE — Telephone Encounter (Signed)
I sent 30 day supply on lipitor.   It looks like somebody else has been refilling the BP medications.  So not sure if he is out of those or not.

## 2019-05-22 ENCOUNTER — Encounter: Payer: Self-pay | Admitting: Medical

## 2019-05-25 ENCOUNTER — Encounter: Payer: Self-pay | Admitting: Family Medicine

## 2019-05-25 ENCOUNTER — Ambulatory Visit (INDEPENDENT_AMBULATORY_CARE_PROVIDER_SITE_OTHER): Payer: Managed Care, Other (non HMO) | Admitting: Family Medicine

## 2019-05-25 ENCOUNTER — Other Ambulatory Visit: Payer: Self-pay

## 2019-05-25 VITALS — BP 160/82 | HR 99 | Temp 100.0°F | Ht 71.0 in | Wt 175.0 lb

## 2019-05-25 DIAGNOSIS — E1022 Type 1 diabetes mellitus with diabetic chronic kidney disease: Secondary | ICD-10-CM | POA: Diagnosis not present

## 2019-05-25 DIAGNOSIS — B20 Human immunodeficiency virus [HIV] disease: Secondary | ICD-10-CM | POA: Diagnosis not present

## 2019-05-25 DIAGNOSIS — U071 COVID-19: Secondary | ICD-10-CM

## 2019-05-25 DIAGNOSIS — N183 Chronic kidney disease, stage 3 unspecified: Secondary | ICD-10-CM

## 2019-05-25 DIAGNOSIS — R112 Nausea with vomiting, unspecified: Secondary | ICD-10-CM | POA: Diagnosis not present

## 2019-05-25 NOTE — Progress Notes (Signed)
Start time: 1:32 End time: 1:58  Virtual Visit via Video Note  I connected with Chase Rollins on 05/25/19 at  1:30 PM EST by a video enabled telemedicine application and verified that I am speaking with the correct person using two identifiers.  Started as video.  He realized the speaker on his computer didn't work, he could not hear me, completed the visit by phone call only.  Location: Patient: home Provider: office   I discussed the limitations of evaluation and management by telemedicine and the availability of in person appointments. The patient expressed understanding and agreed to proceed.  History of Present Illness:  Chief Complaint  Patient presents with  . Follow-up    positive COVID results yesterday 05/24/2019-tested at [facility] in [place]. Patient reports that he cannot keep anything down, also nauseous. Having some acid reflux. He is a type 1 diabetic and his sugars have been in the 300's. No respiratory symptoms. Highest temp has been 102. 100 today.    Tested positive 1/4, got +results yesterday. Symptoms started 12/29 with fatigue, diarrhea, fever, a little vomiting. Vomiting has been more frequent this week, daily. Not able to keep much down. Initially it was nausea, but now he also feels a lot of acid reflux, contributing to his vomiting.  Ondansetron helped with the nausea some yesterday; he was able to sleep last night from 11-5:30, then woke up and vomited . Took it again this morning, and again about an hour ago, but is still vomiting.  He has taken some Tums, Pepto Bismol, with only temporary benefit. He has omeprazole 20mg  (OTC) at home, hasn't tried that one yet. (his husband's medication)  Patient is a type 1 diabetic, and is HIV+, with kidney disease.  He has been evaluated (gone to the classes) for transplant at [facility] Lab Results  Component Value Date   CREATININE 4.12 (H) 02/21/2018   He is past due to see endo (December visit was cancelled,  hasn't seen them since 07/12/2018). Lab Results  Component Value Date   HGBA1C 6.2 (A) 07/12/2018   Sugars have been in 300s this week, was 383 this morning.  He takes Engineer, agricultural 29 U daily.  It is only on basal insulin.  He doesn't have a short-acting insulin as part of his regimen. Mouth is very dry. Trying to suck on ice cubes. Tried an ice pop, but also threw that up. No blurred vision.  +thirst (?not excessive), no polydipsia.  (not experiencing the polydipsia/polyuria/vision changes that he did when first diagnosed with DM as a teenager).  Slight cough, no shortness of breath. No chest pain, leg swelling.   Has some blisters on his toes.  They opened, drained.  They are now dry.  Denies crusting, redness. Had ankle swelling recently but resolved.  PMH, PSH, SH reviewed  Outpatient Encounter Medications as of 05/25/2019  Medication Sig Note  . acetaminophen (TYLENOL) 500 MG tablet Take 1,000 mg by mouth every 6 (six) hours as needed. 05/25/2019: Last dose last night at 10pm  . amLODipine (NORVASC) 10 MG tablet Take 10 mg by mouth at bedtime.   Marland Kitchen BIKTARVY 50-200-25 MG TABS tablet TAKE 1 TABLET BY MOUTH EVERY DAY   . bismuth subsalicylate (PEPTO BISMOL) 262 MG/15ML suspension Take 30 mLs by mouth every 6 (six) hours as needed. 05/25/2019: Last dose 11am  . calcitRIOL (ROCALTROL) 0.25 MCG capsule Take 0.25 mcg by mouth daily.    . calcium carbonate (TUMS - DOSED IN MG ELEMENTAL CALCIUM) 500 MG  chewable tablet Chew 1 tablet by mouth daily. 05/25/2019: Last dose 11:30am  . dicyclomine (BENTYL) 10 MG capsule Take 10 mg by mouth every 6 (six) hours as needed. 05/25/2019: Last dose 8:30am  . furosemide (LASIX) 40 MG tablet Take 40 mg by mouth daily. 05/25/2019: Mon,Wed and Fri  . Insulin Glargine (BASAGLAR KWIKPEN) 100 UNIT/ML SOPN Inject 0.29 mLs (29 Units total) into the skin every morning.   Marland Kitchen lisinopril (ZESTRIL) 40 MG tablet Take 40 mg by mouth at bedtime.   . ondansetron (ZOFRAN-ODT) 4 MG  disintegrating tablet Take 4 mg by mouth every 8 (eight) hours as needed.  05/25/2019: Took at 8:30, and another dose at 1pm  . sodium bicarbonate 650 MG tablet 3 (THREE) TABLET TWO TIMES DAILY   . [DISCONTINUED] furosemide (LASIX) 40 MG tablet Take 40 mg by mouth daily.   Marland Kitchen atorvastatin (LIPITOR) 10 MG tablet Take 1 tablet (10 mg total) by mouth daily at 6 PM. (Patient not taking: Reported on 05/25/2019) 05/25/2019: Patient is out of medication -needs med check.  B[facility] [employer] ophthalmic ointment APPLY INTO RIGHT EYE 3 TIMES A DAY   . [DISCONTINUED] amLODipine (NORVASC) 5 MG tablet Take 5 mg by mouth at bedtime.   . [DISCONTINUED] lisinopril-hydrochlorothiazide (PRINZIDE,ZESTORETIC) 20-12.5 MG tablet TAKE 1 TABLET BY MOUTH DAILY.    No facility-administered encounter medications on file as of 05/25/2019.   No Known Allergies  ROS: +fever, nausea, vomiting, slight cough per HPI.  Denies shortness of breath, chest pain.  Denies bleeding, bruising, rash (just blisters on the toes). See HPI    Observations/Objective:  BP (!) 160/82   Pulse 99   Temp 100 F (37.8 C) (Oral)   Ht 5\' 11"  (1.803 m)   Wt 175 lb (79.4 kg)   BMI 24.41 kg/m   Only briefly saw patient's face.  He is alert, oriented. He periodically was belching and heaving during visit. Between episodes he was speaking comfortably.  Assessment and Plan:  Intractable vomiting with nausea, unspecified vomiting type - vomiting today despite 2 doses of zofran. Concern for developing DKA.  To ER for IV fluids and evaluation  Type 1 diabetes mellitus with stage 3 chronic kidney disease, unspecified whether stage 3a or 3b CKD (HCC) - elevated sugars (suspect related to COVID infection); he has no short-acting insulin to use for sliding scale. No urine ketone strips to assess.  Rec ER eval/tx  COVID-19 virus infection  HIV disease ([place])  Pt agrees to seek treatment in ER.  Husband will take him to [facility], as this is closest hospital to  him. Spoke with ER doctor at [facility] and gave info   Follow Up Instructions:    I discussed the assessment and treatment plan with the patient. The patient was provided an opportunity to ask questions and all were answered. The patient agreed with the plan and demonstrated an understanding of the instructions.   The patient was advised to call back or seek an in-person evaluation if the symptoms worsen or if the condition fails to improve as anticipated.  I provided 26 minutes of non-face-to-face time during this encounter.   Vikki Ports, MD

## 2019-05-28 NOTE — Progress Notes (Deleted)
Cardiology Office Note:    Date:  05/28/2019   ID:  Chase Rollins, DOB 15-May-1981, MRN [id number]  PCP:  Carlena Hurl, PA-C  Cardiologist:  Shirlee More, MD    Referring MD: Carlena Hurl, PA-C    ASSESSMENT:    No diagnosis found. PLAN:    In order of problems listed above:  1. ***   Next appointment: ***   Medication Adjustments/Labs and Tests Ordered: Current medicines are reviewed at length with the patient today.  Concerns regarding medicines are outlined above.  No orders of the defined types were placed in this encounter.  No orders of the defined types were placed in this encounter.   No chief complaint on file.   History of Present Illness:    Chase Rollins is a 39 y.o. male with a hx of  HTN, DM1 since 85, CKDIV with plan for fistula/graft placement by VVS, HIV, HLD. He is followed by his PCP, nephrology, endocrinology, and vascular surgery. He has been referred for kidney transplant to [facility] with workup delayed due to [place].  He was last seen 10/27/2018.  He had a systolic ejection murmur high velocity due to flow and echocardiogram showed no valvular pathology or LV outflow tract obstruction. Compliance with diet, lifestyle and medications: ***  Echo 11/23/2018:  1. The left ventricle has normal systolic function with an ejection fraction of 60-65%. The cavity size was normal. There is mildly increased left ventricular wall thickness. Left ventricular diastolic parameters were normal.  2. The right ventricle has normal systolic function. The cavity was normal. There is no increase in right ventricular wall thickness.  He was admitted to [facility] 05/26/2019 with COVID-19 and bilateral pneumonia. Past Medical History:  Diagnosis Date  . Anemia   . Anemia associated with chronic renal failure   . Chronic kidney disease    Stage IV  . Diabetes ([place])   . HIV (human immunodeficiency virus infection) ([place])   .  HTN (hypertension)     Past Surgical History:  Procedure Laterality Date  . NO PAST SURGERIES      Current Medications: No outpatient medications have been marked as taking for the 05/29/19 encounter (Appointment) with Richardo Priest, MD.     Allergies:   Patient has no known allergies.   Social History   Socioeconomic History  . Marital status: Single    Spouse name: Not on file  . Number of children: Not on file  . Years of education: Not on file  . Highest education level: Not on file  Occupational History  . Not on file  Tobacco Use  . Smoking status: Never Smoker  . Smokeless tobacco: Never Used  Substance and Sexual Activity  . Alcohol use: Yes    Comment: socially  . Drug use: No  . Sexual activity: Yes    Partners: Male    Comment: pt declined condoms  Other Topics Concern  . Not on file  Social History Narrative  . Not on file   Social Determinants of Health   Financial Resource Strain:   . Difficulty of Paying Living Expenses: Not on file  Food Insecurity:   . Worried About Charity fundraiser in the Last Year: Not on file  . Ran Out of Food in the Last Year: Not on file  Transportation Needs:   . Lack of Transportation (Medical): Not on file  . Lack of Transportation (Non-Medical): Not on file  Physical Activity:   . Days of Exercise per Week: Not on file  . Minutes of Exercise per Session: Not on file  Stress:   . Feeling of Stress : Not on file  Social Connections:   . Frequency of Communication with Friends and Family: Not on file  . Frequency of Social Gatherings with Friends and Family: Not on file  . Attends Religious Services: Not on file  . Active Member of Clubs or Organizations: Not on file  . Attends Archivist Meetings: Not on file  . Marital Status: Not on file     Family History: The patient's ***family history includes Diabetes in his mother and paternal grandmother; Heart Problems in his paternal aunt; Hypertension  in his father and mother. ROS:   Please see the history of present illness.    All other systems reviewed and are negative.  EKGs/Labs/Other Studies Reviewed:    The following studies were reviewed today:  EKG:  EKG ordered today and personally reviewed.  The ekg ordered today demonstrates ***  Recent Labs: No results found for requested labs within last 8760 hours.  Recent Lipid Panel    Component Value Date/Time   CHOL 135 09/27/2017 1002   TRIG 91 09/27/2017 1002   HDL 40 09/27/2017 1002   CHOLHDL 3.4 09/27/2017 1002   CHOLHDL 4.3 04/02/2016 1434   VLDL 24 04/02/2016 1434   LDLCALC 77 09/27/2017 1002    Physical Exam:    VS:  There were no vitals taken for this visit.    Wt Readings from Last 3 Encounters:  05/25/19 175 lb (79.4 kg)  11/01/18 176 lb (79.8 kg)  10/27/18 174 lb 1.9 oz (79 kg)     GEN: *** Well nourished, well developed in no acute distress HEENT: Normal NECK: No JVD; No carotid bruits LYMPHATICS: No lymphadenopathy CARDIAC: ***RRR, no murmurs, rubs, gallops RESPIRATORY:  Clear to auscultation without rales, wheezing or rhonchi  ABDOMEN: Soft, non-tender, non-distended MUSCULOSKELETAL:  No edema; No deformity  SKIN: Warm and dry NEUROLOGIC:  Alert and oriented x 3 PSYCHIATRIC:  Normal affect    Signed, Shirlee More, MD  05/28/2019 1:17 PM    [facility]

## 2019-05-29 ENCOUNTER — Ambulatory Visit: Payer: Self-pay | Admitting: Cardiology

## 2019-05-29 MED ORDER — GLUCOSE 40 % PO GEL
15.00 | ORAL | Status: DC
Start: ? — End: 2019-05-29

## 2019-05-29 MED ORDER — MAGNESIUM SULFATE 2 GM/50ML IV SOLN
2.00 | INTRAVENOUS | Status: DC
Start: 2019-05-29 — End: 2019-05-29

## 2019-05-29 MED ORDER — SODIUM CHLORIDE 0.9 % IV SOLN
INTRAVENOUS | Status: DC
Start: ? — End: 2019-05-29

## 2019-05-29 MED ORDER — PROCHLORPERAZINE EDISYLATE 10 MG/2ML IJ SOLN
5.00 | INTRAMUSCULAR | Status: DC
Start: ? — End: 2019-05-29

## 2019-05-29 MED ORDER — ACETAMINOPHEN 325 MG PO TABS
650.00 | ORAL_TABLET | ORAL | Status: DC
Start: ? — End: 2019-05-29

## 2019-05-29 MED ORDER — FAMOTIDINE 10 MG PO TABS
10.00 | ORAL_TABLET | ORAL | Status: DC
Start: 2019-05-30 — End: 2019-05-29

## 2019-05-29 MED ORDER — INSULIN GLARGINE 100 UNIT/ML SOLOSTAR PEN
29.00 | PEN_INJECTOR | SUBCUTANEOUS | Status: DC
Start: 2019-05-30 — End: 2019-05-29

## 2019-05-29 MED ORDER — CALCIUM CARBONATE ANTACID 500 MG PO CHEW
500.00 | CHEWABLE_TABLET | ORAL | Status: DC
Start: ? — End: 2019-05-29

## 2019-05-29 MED ORDER — DEXTROSE 10 % IV SOLN
125.00 | INTRAVENOUS | Status: DC
Start: ? — End: 2019-05-29

## 2019-05-29 MED ORDER — ATORVASTATIN CALCIUM 10 MG PO TABS
10.00 | ORAL_TABLET | ORAL | Status: DC
Start: 2019-05-30 — End: 2019-05-29

## 2019-05-29 MED ORDER — CALCITRIOL 0.25 MCG PO CAPS
0.25 | ORAL_CAPSULE | ORAL | Status: DC
Start: 2019-05-30 — End: 2019-05-29

## 2019-05-29 MED ORDER — INSULIN LISPRO 100 UNIT/ML ~~LOC~~ SOLN
1.00 | SUBCUTANEOUS | Status: DC
Start: 2019-05-29 — End: 2019-05-29

## 2019-05-29 MED ORDER — ACETAMINOPHEN 650 MG RE SUPP
650.00 | RECTAL | Status: DC
Start: ? — End: 2019-05-29

## 2019-05-29 MED ORDER — HEPARIN SODIUM (PORCINE) 5000 UNIT/ML IJ SOLN
5000.00 | INTRAMUSCULAR | Status: DC
Start: 2019-05-29 — End: 2019-05-29

## 2019-05-29 MED ORDER — BICTEGRAVIR-EMTRICITAB-TENOFOV 50-200-25 MG PO TABS
1.00 | ORAL_TABLET | ORAL | Status: DC
Start: 2019-05-30 — End: 2019-05-29

## 2019-05-29 MED ORDER — K PHOS MONO-SOD PHOS DI & MONO 155-852-130 MG PO TABS
500.00 | ORAL_TABLET | ORAL | Status: DC
Start: 2019-05-29 — End: 2019-05-29

## 2019-05-29 MED ORDER — ONDANSETRON HCL 4 MG/2ML IJ SOLN
4.00 | INTRAMUSCULAR | Status: DC
Start: ? — End: 2019-05-29

## 2019-05-29 MED ORDER — AMLODIPINE BESYLATE 5 MG PO TABS
10.00 | ORAL_TABLET | ORAL | Status: DC
Start: 2019-05-29 — End: 2019-05-29

## 2019-05-29 MED ORDER — SODIUM BICARBONATE 650 MG PO TABS
1950.00 | ORAL_TABLET | ORAL | Status: DC
Start: 2019-05-29 — End: 2019-05-29

## 2019-06-06 ENCOUNTER — Ambulatory Visit (INDEPENDENT_AMBULATORY_CARE_PROVIDER_SITE_OTHER): Payer: 59 | Admitting: Endocrinology

## 2019-06-06 ENCOUNTER — Other Ambulatory Visit: Payer: Self-pay

## 2019-06-06 ENCOUNTER — Encounter: Payer: Self-pay | Admitting: Endocrinology

## 2019-06-06 DIAGNOSIS — E1021 Type 1 diabetes mellitus with diabetic nephropathy: Secondary | ICD-10-CM

## 2019-06-06 DIAGNOSIS — N1831 Chronic kidney disease, stage 3a: Secondary | ICD-10-CM

## 2019-06-06 DIAGNOSIS — E1022 Type 1 diabetes mellitus with diabetic chronic kidney disease: Secondary | ICD-10-CM

## 2019-06-06 MED ORDER — BASAGLAR KWIKPEN 100 UNIT/ML ~~LOC~~ SOPN: 32.0000 [IU] | PEN_INJECTOR | SUBCUTANEOUS | 2 refills | Status: DC

## 2019-06-06 NOTE — Patient Instructions (Addendum)
check your blood sugar twice a day.  vary the time of day when you check, between before the 3 meals, and at bedtime.  also check if you have symptoms of your blood sugar being too high or too low.  please keep a record of the readings and bring it to your next appointment here (or you can bring the meter itself).  You can write it on any piece of paper.  please call us sooner if your blood sugar goes below 70, or if you have a lot of readings over 200.   Please increase the insulin to 32 units each morning.   On this type of insulin schedule, you should eat meals on a regular schedule.  If a meal is missed or significantly delayed, your blood sugar could go low.   Keep the blisters covered with antibiotic ointment and large bandaids, until they heal.  Please call your primary care provider if these get worse.   Please come back for a follow-up appointment in 2 months.

## 2019-06-06 NOTE — Progress Notes (Signed)
Subjective:    Patient ID: Chase Rollins, male    DOB: 08/28/1980, 39 y.o.   MRN: [id number]  HPI  telehealth visit today via doxy video visit.  Alternatives to telehealth are presented to this patient, and the patient agrees to the telehealth visit. Pt is advised of the cost of the visit, and agrees to this, also.   Patient is at home, and I am at the office.   Persons attending the telehealth visit: the patient and I Pt returns for f/u of diabetes mellitus: DM type: 1 Dx'ed: 0000000 Complications: polyneuropathy, DR, and renal failure.   Therapy: insulin since soon after dx.  DKA: never Severe hypoglycemia: never.  Pancreatitis: never.  Pancreatic imaging: never.  Other: he takes qd insulin, after poor results with more frequent injections; fructosamine converts to A1c approx 1% higher than a1c itself.   Interval history: no cbg record, but states cbg's are in the 200's.  He takes insulin as rx'ed.  He was recently in the hospital with renal failure.  He had pos coronavirus test, but no steroids.  pt states he feels better in general. Past Medical History:  Diagnosis Date  . Anemia   . Anemia associated with chronic renal failure   . Chronic kidney disease    Stage IV  . Diabetes ([place])   . HIV (human immunodeficiency virus infection) ([place])   . HTN (hypertension)     Past Surgical History:  Procedure Laterality Date  . NO PAST SURGERIES      Social History   Socioeconomic History  . Marital status: Single    Spouse name: Not on file  . Number of children: Not on file  . Years of education: Not on file  . Highest education level: Not on file  Occupational History  . Not on file  Tobacco Use  . Smoking status: Never Smoker  . Smokeless tobacco: Never Used  Substance and Sexual Activity  . Alcohol use: Yes    Comment: socially  . Drug use: No  . Sexual activity: Yes    Partners: Male    Comment: pt declined condoms  Other Topics Concern  . Not on file   Social History Narrative  . Not on file   Social Determinants of Health   Financial Resource Strain:   . Difficulty of Paying Living Expenses: Not on file  Food Insecurity:   . Worried About Charity fundraiser in the Last Year: Not on file  . Ran Out of Food in the Last Year: Not on file  Transportation Needs:   . Lack of Transportation (Medical): Not on file  . Lack of Transportation (Non-Medical): Not on file  Physical Activity:   . Days of Exercise per Week: Not on file  . Minutes of Exercise per Session: Not on file  Stress:   . Feeling of Stress : Not on file  Social Connections:   . Frequency of Communication with Friends and Family: Not on file  . Frequency of Social Gatherings with Friends and Family: Not on file  . Attends Religious Services: Not on file  . Active Member of Clubs or Organizations: Not on file  . Attends Archivist Meetings: Not on file  . Marital Status: Not on file  Intimate Partner Violence:   . Fear of Current or Ex-Partner: Not on file  . Emotionally Abused: Not on file  . Physically Abused: Not on file  . Sexually Abused: Not on file  Current Outpatient Medications on File Prior to Visit  Medication Sig Dispense Refill  . acetaminophen (TYLENOL) 500 MG tablet Take 1,000 mg by mouth every 6 (six) hours as needed.    Marland Kitchen amLODipine (NORVASC) 10 MG tablet Take 10 mg by mouth at bedtime.    Marland Kitchen atorvastatin (LIPITOR) 10 MG tablet Take 1 tablet (10 mg total) by mouth daily at 6 PM. 30 tablet 0  . BIKTARVY 50-200-25 MG TABS tablet TAKE 1 TABLET BY MOUTH EVERY DAY 30 tablet 2  . bismuth subsalicylate (PEPTO BISMOL) 262 MG/15ML suspension Take 30 mLs by mouth every 6 (six) hours as needed.    . calcitRIOL (ROCALTROL) 0.25 MCG capsule Take 0.25 mcg by mouth daily.   6  . calcium carbonate (TUMS - DOSED IN MG ELEMENTAL CALCIUM) 500 MG chewable tablet Chew 1 tablet by mouth daily.    Marland Kitchen dicyclomine (BENTYL) 10 MG capsule Take 10 mg by mouth every  6 (six) hours as needed.    . furosemide (LASIX) 40 MG tablet Take 40 mg by mouth daily.    Marland Kitchen lisinopril (ZESTRIL) 40 MG tablet Take 40 mg by mouth at bedtime.    . ondansetron (ZOFRAN-ODT) 4 MG disintegrating tablet Take 4 mg by mouth every 8 (eight) hours as needed.     . sodium bicarbonate 650 MG tablet 3 (THREE) TABLET TWO TIMES DAILY    . TOBRADEX ophthalmic ointment APPLY INTO RIGHT EYE 3 TIMES A DAY     No current facility-administered medications on file prior to visit.    No Known Allergies  Family History  Problem Relation Age of Onset  . Diabetes Mother   . Hypertension Mother   . Diabetes Paternal Grandmother   . Hypertension Father   . Heart Problems Paternal Aunt     There were no vitals taken for this visit.   Review of Systems He denies hypoglycemia.  He has blisters on the toes, but no open ulcers.    Objective:   Physical Exam   outside test results are reviewed: A1c=7.9%    Assessment & Plan:  Type 2 DM, with DR: worse.  Renal failure: A1c underestimates avg glucose.   Patient Instructions  check your blood sugar twice a day.  vary the time of day when you check, between before the 3 meals, and at bedtime.  also check if you have symptoms of your blood sugar being too high or too low.  please keep a record of the readings and bring it to your next appointment here (or you can bring the meter itself).  You can write it on any piece of paper.  please call us sooner if your blood sugar goes below 70, or if you have a lot of readings over 200.   Please increase the insulin to 32 units each morning.   On this type of insulin schedule, you should eat meals on a regular schedule.  If a meal is missed or significantly delayed, your blood sugar could go low.   Keep the blisters covered with antibiotic ointment and large bandaids, until they heal.  Please call your primary care provider if these get worse.   Please come back for a follow-up appointment in 2 months.

## 2019-06-10 IMAGING — CT CT ABDOMEN AND PELVIS WITHOUT CONTRAST
1 of 2 series · 13 of 32 positions shown, 18 images · non-contrast
Comparison: Renal sonogram 10/02/2016.  No comparison CT.

CLINICAL DATA: 38-year-old male with diabetes, hypertension, HIV
and chronic kidney disease. Evaluate retroperitoneal space for
possible kidney transplant. Initial encounter.

EXAM:
CT ABDOMEN AND PELVIS WITHOUT CONTRAST
TECHNIQUE: Multidetector CT imaging of the abdomen and pelvis was performed
following the standard protocol without IV contrast.

[Series 4: abd/pelvis w/(date) · axial · 0.66mm/px · z∈[-378,-8]mm · 13 of 84 slices shown, 18 images]
[im 5/84  soft-tissue]
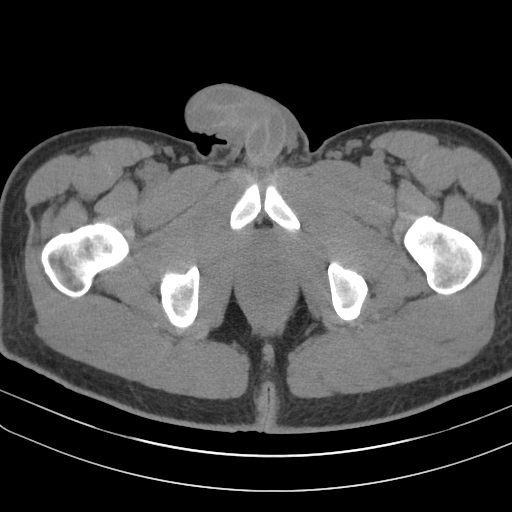
[im 5/84  bone]
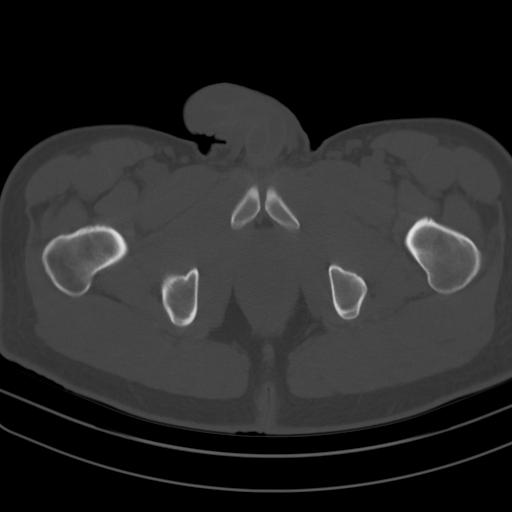
[im 14/84  soft-tissue]
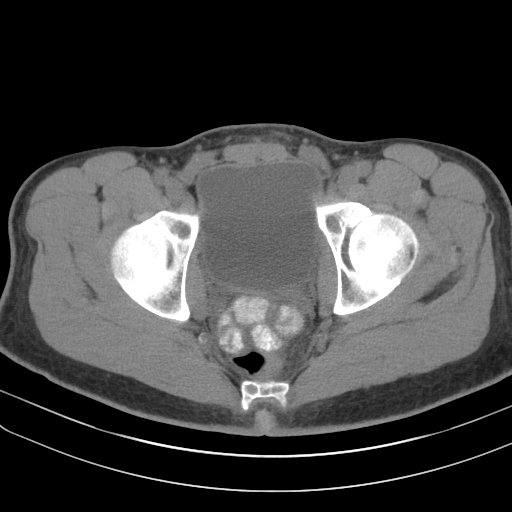
[im 19/84  soft-tissue]
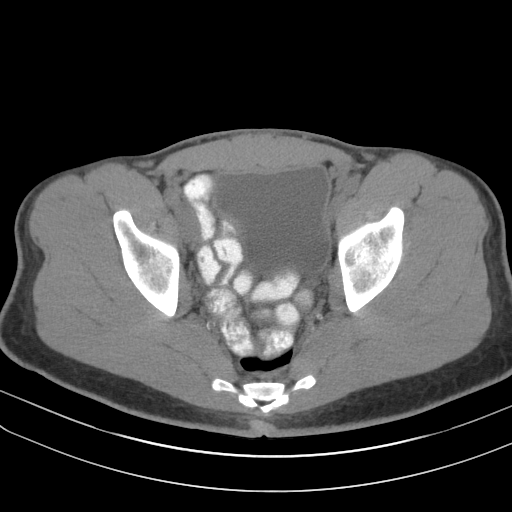
[im 24/84  soft-tissue]
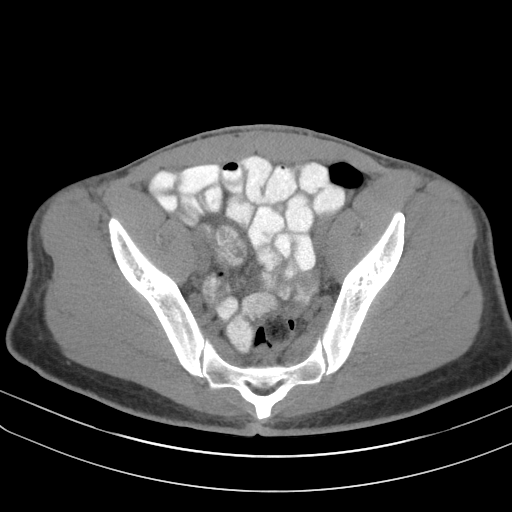
[im 33/84  soft-tissue]
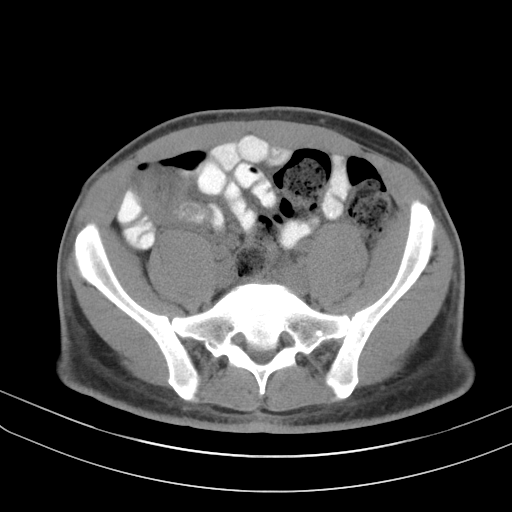
[im 37/84  soft-tissue]
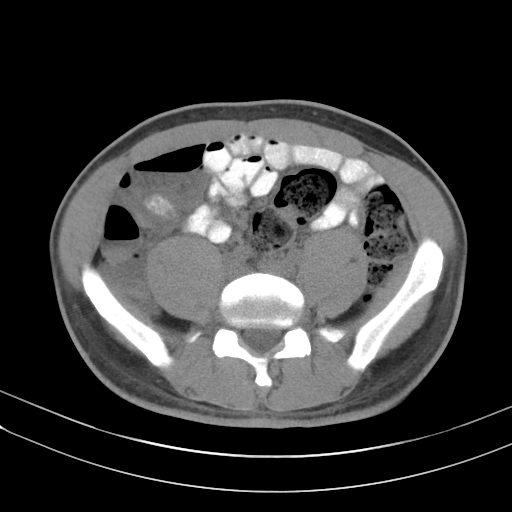
[im 47/84  soft-tissue]
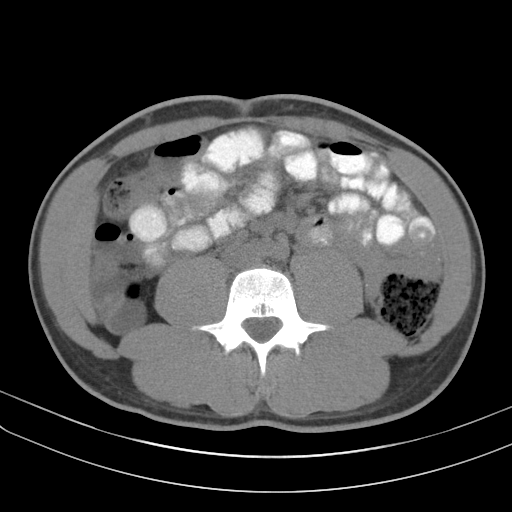
[im 51/84  soft-tissue]
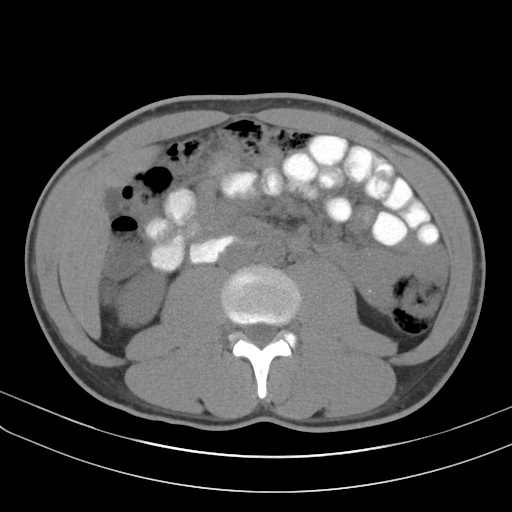
[im 60/84  soft-tissue]
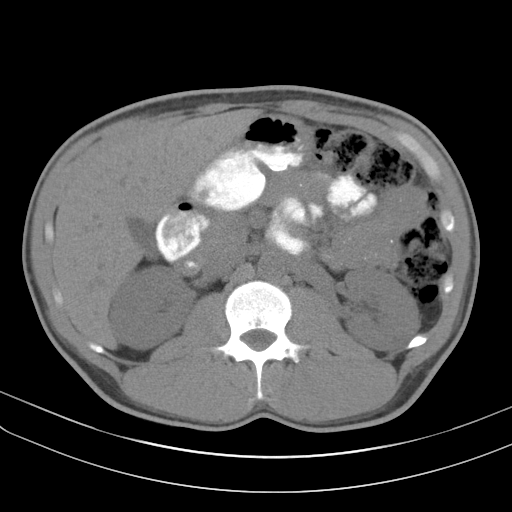
[im 60/84  bone]
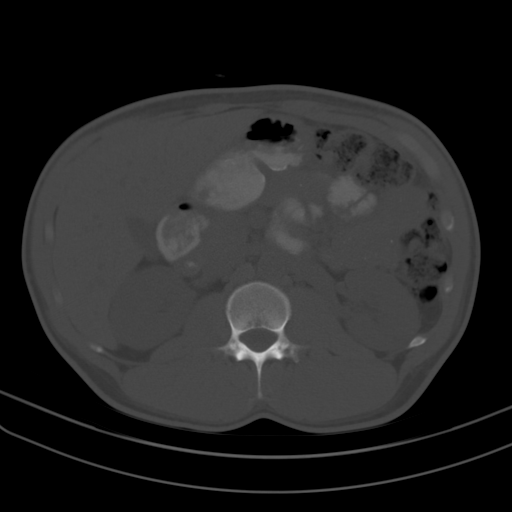
[im 65/84  soft-tissue]
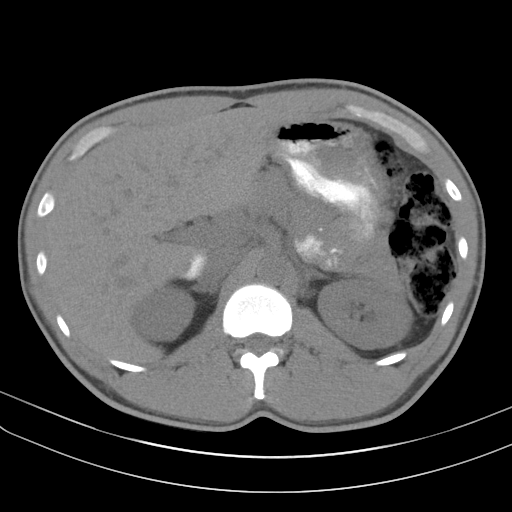
[im 65/84  lung]
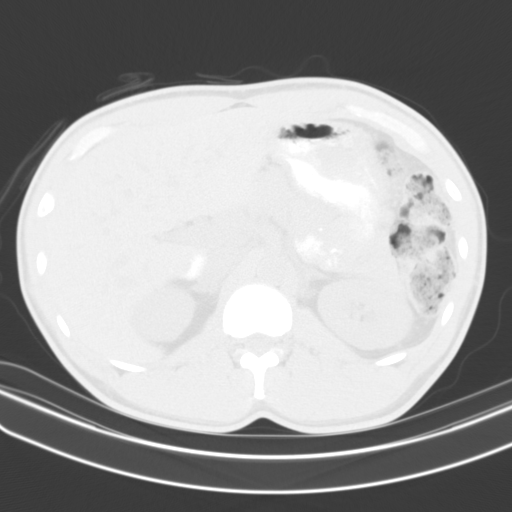
[im 70/84  soft-tissue]
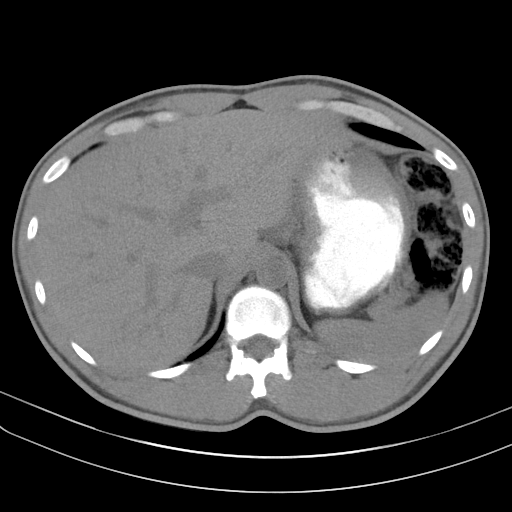
[im 70/84  lung]
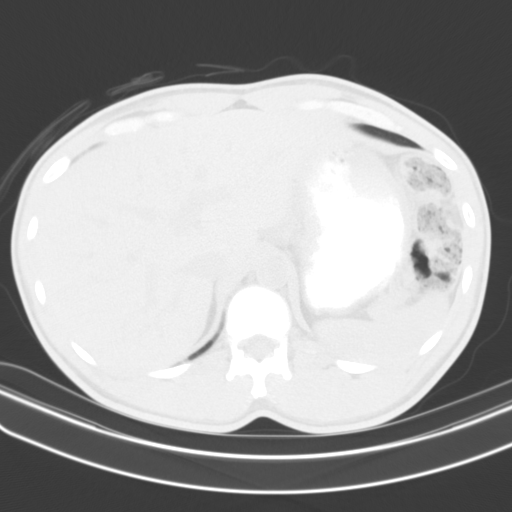
[im 74/84  lung]
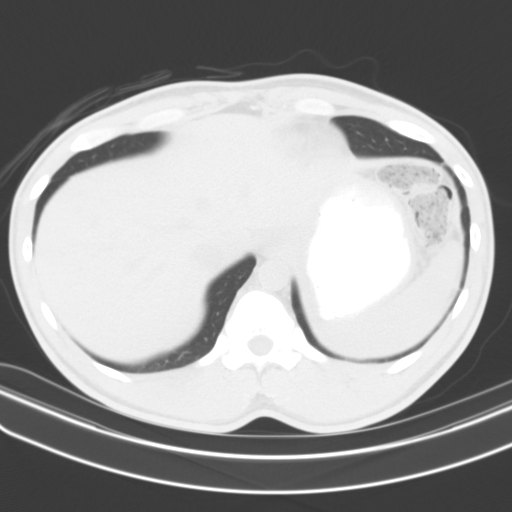
[im 79/84  soft-tissue]
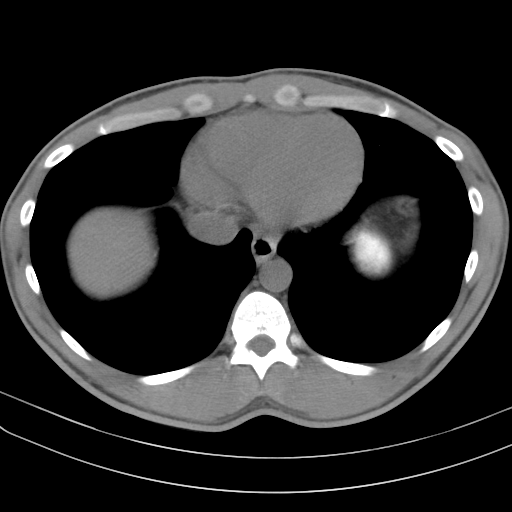
[im 79/84  lung]
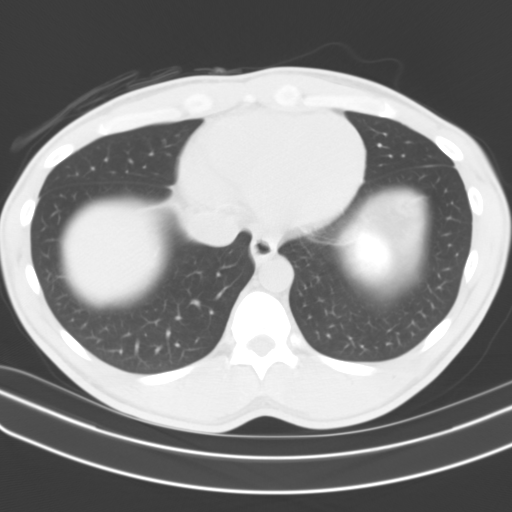

[13 of 32 positions shown; findings below may reference images not displayed]

FINDINGS: Lower chest: Minimal scarring/atelectasis lung bases. Heart size
within normal limits.

Hepatobiliary: Elongated right lobe of liver. Possible 5 mm cyst
right lobe liver. Taking into account limitation by non contrast
imaging, no worrisome hepatic lesion. No calcified gallstone or CT
evidence of gallbladder inflammation.

Pancreas: Evaluation of the pancreas is limited by lack of contrast
and adjacent unopacified bowel. Taking this limitation into account,
no obvious worrisome mass or inflammation.

Spleen: Taking into account limitation by non contrast imaging, no
worrisome splenic mass or enlargement.

Adrenals/Urinary Tract: No obstructing stone or hydronephrosis. Left
upper pole 7 mm hyperdense structure suggestive of a hyperdense
cysts and left lower pole 5 mm low-density structure suggestive of a
cyst. These structures are too small to adequately characterize.

No adrenal lesion noted.

Noncontrast filled views the urinary bladder without primary urinary
bladder abnormality. Minimal impression upon the bladder base by
prostate gland.

Stomach/Bowel: Evaluation of bowel limited by lack of fat planes and
areas of under distension.

Vascular/Lymphatic: No abdominal aortic aneurysm.

Top-normal size left external iliac lymph node. Top-normal size
mesenteric lymph nodes. Otherwise scattered normal size lymph nodes.

Reproductive: Prominent size prostate gland with impression upon the
bladder base. Slightly asymmetric seminal vesicles.

Other: No free air or bowel containing hernia. Prominent psoas
muscles.

Musculoskeletal: Minimal Schmorl's node deformity inferior endplate
L3. Mild sclerosis adjacent to left sacroiliac joint.
IMPRESSION: 1. Exam limited by lack of intravenous contrast and lack of fat
planes.
2. No retroperitoneal mass identified. Prominent psoas muscles
incidentally noted.
3. Left renal subcentimeter hyperdense and simple cyst may be
present although too small to characterize.
4. Prominent size prostate gland with slight impression upon the
bladder base.
5. Scattered normal/top-normal size lymph nodes.

## 2019-06-20 ENCOUNTER — Other Ambulatory Visit: Payer: Self-pay | Admitting: Medical

## 2019-07-11 ENCOUNTER — Encounter: Payer: Self-pay | Admitting: Medical

## 2019-07-11 ENCOUNTER — Telehealth: Payer: Self-pay | Admitting: Medical

## 2019-07-11 NOTE — Telephone Encounter (Signed)
Called pt but unable to leave VM due to it being full

## 2019-07-11 NOTE — Telephone Encounter (Signed)
Please schedule for med check  Or physical fasting

## 2019-07-11 NOTE — Telephone Encounter (Signed)
Letter printed and mailed.  

## 2019-07-11 NOTE — Telephone Encounter (Signed)
Please mail an appointment reminder since this continues to be an issue

## 2019-07-27 ENCOUNTER — Other Ambulatory Visit: Payer: Self-pay | Admitting: Internal Medicine

## 2019-07-27 ENCOUNTER — Telehealth: Payer: Self-pay

## 2019-07-27 DIAGNOSIS — B20 Human immunodeficiency virus [HIV] disease: Secondary | ICD-10-CM

## 2019-07-27 NOTE — Telephone Encounter (Signed)
Attempted to call patient after receiving refill request for [facility]. Patient missed last appointment and has not rescheduled. Left voicemail requesting patient call office back to schedule appointment for lab work and office visit. [place]

## 2019-08-24 ENCOUNTER — Other Ambulatory Visit: Payer: Self-pay | Admitting: Nephrology

## 2019-08-24 DIAGNOSIS — M7989 Other specified soft tissue disorders: Secondary | ICD-10-CM

## 2019-08-30 ENCOUNTER — Ambulatory Visit
Admission: RE | Admit: 2019-08-30 | Discharge: 2019-08-30 | Disposition: A | Discharge status: Home / Self Care | Payer: 59 | Source: Ambulatory Visit | Attending: Nephrology | Admitting: Nephrology

## 2019-08-30 DIAGNOSIS — M7989 Other specified soft tissue disorders: Secondary | ICD-10-CM

## 2019-09-13 ENCOUNTER — Other Ambulatory Visit: Payer: Self-pay | Admitting: Medical

## 2019-09-13 NOTE — Telephone Encounter (Signed)
Patient has been sent several reminder of needed appointment. Please advise.

## 2019-09-13 NOTE — Telephone Encounter (Signed)
Chase Rollins  ([name] to [place])  Please send a letter of dismissal.  He does not come in for routine care.  I am getting refill request for chronic medicines which he has not returned for follow-up on despite numerous prior efforts to reach out to him  I am not going to be responsible for his care if he is going to be noncompliant  Audelia Acton

## 2019-09-19 ENCOUNTER — Encounter: Payer: Self-pay | Admitting: Family Medicine

## 2019-09-19 ENCOUNTER — Encounter (INDEPENDENT_AMBULATORY_CARE_PROVIDER_SITE_OTHER): Payer: Self-pay | Admitting: Ophthalmology

## 2019-09-19 NOTE — Telephone Encounter (Signed)
Dismissal letter sent.

## 2019-09-28 ENCOUNTER — Ambulatory Visit (INDEPENDENT_AMBULATORY_CARE_PROVIDER_SITE_OTHER): Payer: 59 | Admitting: Ophthalmology

## 2019-09-28 ENCOUNTER — Other Ambulatory Visit: Payer: Self-pay

## 2019-09-28 ENCOUNTER — Encounter (INDEPENDENT_AMBULATORY_CARE_PROVIDER_SITE_OTHER): Payer: Self-pay | Admitting: Ophthalmology

## 2019-09-28 DIAGNOSIS — H2513 Age-related nuclear cataract, bilateral: Secondary | ICD-10-CM | POA: Diagnosis not present

## 2019-09-28 DIAGNOSIS — E103492 Type 1 diabetes mellitus with severe nonproliferative diabetic retinopathy without macular edema, left eye: Secondary | ICD-10-CM

## 2019-09-28 DIAGNOSIS — E103491 Type 1 diabetes mellitus with severe nonproliferative diabetic retinopathy without macular edema, right eye: Secondary | ICD-10-CM

## 2019-09-28 NOTE — Patient Instructions (Signed)
The nature of diabetic retinopathy was explained using the following analogy: "Retinopathy develops in the body's blood supply like salty water corrodes the linings of pipes in a house, until rust appears, then holes in the pipes develop which leak followed by destruction and loss of the pipes as the corrosion turns them to dust. In a similar fashion, Diabetes damages the blood supply of the body by cumulative long--term elevated blood sugar, which corrodes the blood supply in the body, particularly the blood vessels supplying the retina, kidneys, and nerves".  Thus, control of blood sugar, slows the progression of the corrosive effect of diabetes mellitus.    

## 2019-09-28 NOTE — Progress Notes (Signed)
09/28/2019     CHIEF COMPLAINT Patient presents for Retina Follow Up   HISTORY OF PRESENT ILLNESS: Chase Rollins is a 39 y.o. male who presents to the clinic today for:   HPI    Retina Follow Up    Patient presents with  Diabetic Retinopathy.  In both eyes.  Duration of 6 months.  Since onset it is stable.          Comments    6 month follow up - OCT OU Patient denies change in vision and overall has no complaints. LBS 182 this AM A1C "maybe a 7"       Last edited by Gerda Diss on 09/28/2019  3:04 PM. (History)      Referring physician: Carlena Hurl, PA-C [place],  [place] [address]  HISTORICAL INFORMATION:   Selected notes from the MEDICAL RECORD NUMBER    Lab Results  Component Value Date   HGBA1C 6.2 (A) 07/12/2018     CURRENT MEDICATIONS: Current Outpatient Medications (Ophthalmic Drugs)  Medication Sig  . TOBRADEX ophthalmic ointment APPLY INTO RIGHT EYE 3 TIMES A DAY   No current facility-administered medications for this visit. (Ophthalmic Drugs)   Current Outpatient Medications (Other)  Medication Sig  . acetaminophen (TYLENOL) 500 MG tablet Take 1,000 mg by mouth every 6 (six) hours as needed.  Marland Kitchen amLODipine (NORVASC) 10 MG tablet Take 10 mg by mouth at bedtime.  Marland Kitchen atorvastatin (LIPITOR) 10 MG tablet TAKE 1 TABLET (10 MG TOTAL) BY MOUTH ONCE DAILY AT 6 PM.  . BIKTARVY 50-200-25 MG TABS tablet TAKE 1 TABLET BY MOUTH EVERY DAY  . bismuth subsalicylate (PEPTO BISMOL) 262 MG/15ML suspension Take 30 mLs by mouth every 6 (six) hours as needed.  . calcitRIOL (ROCALTROL) 0.25 MCG capsule Take 0.25 mcg by mouth daily.   . calcium carbonate (TUMS - DOSED IN MG ELEMENTAL CALCIUM) 500 MG chewable tablet Chew 1 tablet by mouth daily.  Marland Kitchen dicyclomine (BENTYL) 10 MG capsule Take 10 mg by mouth every 6 (six) hours as needed.  . furosemide (LASIX) 40 MG tablet Take 40 mg by mouth daily.  . Insulin Glargine (BASAGLAR KWIKPEN) 100 UNIT/ML  SOPN Inject 0.32 mLs (32 Units total) into the skin every morning.  Marland Kitchen lisinopril (ZESTRIL) 40 MG tablet Take 40 mg by mouth at bedtime.  . ondansetron (ZOFRAN-ODT) 4 MG disintegrating tablet Take 4 mg by mouth every 8 (eight) hours as needed.   . sodium bicarbonate 650 MG tablet 3 (THREE) TABLET TWO TIMES DAILY   No current facility-administered medications for this visit. (Other)      REVIEW OF SYSTEMS:    ALLERGIES No Known Allergies  PAST MEDICAL HISTORY Past Medical History:  Diagnosis Date  . Anemia   . Anemia associated with chronic renal failure   . Chronic kidney disease    Stage IV  . Diabetes ([place])   . HIV (human immunodeficiency virus infection) ([place])   . HTN (hypertension)    Past Surgical History:  Procedure Laterality Date  . NO PAST SURGERIES      FAMILY HISTORY Family History  Problem Relation Age of Onset  . Diabetes Mother   . Hypertension Mother   . Diabetes Paternal Grandmother   . Hypertension Father   . Heart Problems Paternal Aunt     SOCIAL HISTORY Social History   Tobacco Use  . Smoking status: Never Smoker  . Smokeless tobacco: Never Used  Substance Use Topics  . Alcohol  use: Yes    Comment: socially  . Drug use: No         OPHTHALMIC EXAM:  Base Eye Exam    Visual Acuity (Snellen - Linear)      Right Left   Dist [place] 20/20 20/20-1       Tonometry (Tonopen, 3:07 PM)      Right Left   Pressure 17 19       Pupils      Pupils Dark Light Shape React APD   Right PERRL 3 3 Round Minimal None   Left PERRL 3 3 Round Minimal None       Visual Fields (Counting fingers)      Left Right    Full Full       Extraocular Movement      Right Left    Full Full       Neuro/Psych    Oriented x3: Yes   Mood/Affect: Normal       Dilation    Both eyes: 1.0% Mydriacyl, 2.5% Phenylephrine @ 3:07 PM        Slit Lamp and Fundus Exam    External Exam      Right Left   External Normal Normal       Slit Lamp Exam       Right Left   Lids/Lashes Normal Normal   Conjunctiva/Sclera White and quiet White and quiet   Cornea Clear Clear   Anterior Chamber Deep and quiet Deep and quiet   Iris Round and reactive Round and reactive   Lens Nuclear sclerosis Nuclear sclerosis   Anterior Vitreous Normal Normal       Fundus Exam      Right Left   Posterior Vitreous Normal Normal   Disc Normal Normal   C/D Ratio 0.2 0.2   Macula Microaneurysms, no macular thickening, no exudates Exudates, Microaneurysms, no macular thickening   Vessels NPDR severe NPDR severe   Periphery Normal Moderate scatter pattern PRP inferiorly          IMAGING AND PROCEDURES  Imaging and Procedures for 09/28/19  OCT, Retina - OU - Both Eyes       Right Eye Quality was good. Scan locations included subfoveal. Central Foveal Thickness: 242. Progression has been stable.   Left Eye Quality was good. Scan locations included subfoveal. Central Foveal Thickness: 259. Progression has been stable. Findings include normal observations.   Notes OD  with an area inferotemporal to the macula, this may have been a region of previous hypertensive retinopathy damage  OS is normal.  No active maculopathy OU.                ASSESSMENT/PLAN:  Severe nonproliferative diabetic retinopathy of right eye, without macular edema, associated with type 1 diabetes mellitus ([place]) The nature of severe nonproliferative diabetic retinopathy discussed with the patient as well as the need for more frequent follow up and likely progression to proliferative disease in the near future. The options of continued observation versus panretinal photocoagulation at this time were reviewed as well as the risks, benefits, and alternatives. More recent option includes the use of ocular injectable medications to slow progression of retinal disease. Tight control of glucose, blood pressure, and serum lipid levels were recommended under the direction of general  physician or endocrinologist, as well as avoidance of smoking and maintenance of normal body weight. The 2-year risk of progression to proliferative diabetic retinopathy is 60%.  Severe nonproliferative diabetic retinopathy of left eye,  without macular edema, associated with type 1 diabetes mellitus ([place]) The nature of severe nonproliferative diabetic retinopathy discussed with the patient as well as the need for more frequent follow up and likely progression to proliferative disease in the near future. The options of continued observation versus panretinal photocoagulation at this time were reviewed as well as the risks, benefits, and alternatives. More recent option includes the use of ocular injectable medications to slow progression of retinal disease. Tight control of glucose, blood pressure, and serum lipid levels were recommended under the direction of general physician or endocrinologist, as well as avoidance of smoking and maintenance of normal body weight. The 2-year risk of progression to proliferative diabetic retinopathy is 60%.      ICD-10-CM   1. Severe nonproliferative diabetic retinopathy of right eye, without macular edema, associated with type 1 diabetes mellitus (HCC)  E10.3491 OCT, Retina - OU - Both Eyes  2. Severe nonproliferative diabetic retinopathy of left eye, without macular edema, associated with type 1 diabetes mellitus (HCC)  E10.3492 OCT, Retina - OU - Both Eyes  3. Nuclear sclerotic cataract of both eyes  H25.13     1.  Continue with excellent blood sugar control  2.  Continue to monitor hypertension and its control  3.  No progression of diabetic eye disease today  Ophthalmic Meds Ordered this visit:  No orders of the defined types were placed in this encounter.      Return in about 6 months (around 03/30/2020) for DILATE OU, COLOR FP.  Patient Instructions  The nature of diabetic retinopathy was explained using the following analogy: "Retinopathy  develops in the body's blood supply like salty water corrodes the linings of pipes in a house, until rust appears, then holes in the pipes develop which leak followed by destruction and loss of the pipes as the corrosion turns them to dust. In a similar fashion, Diabetes damages the blood supply of the body by cumulative long--term elevated blood sugar, which corrodes the blood supply in the body, particularly the blood vessels supplying the retina, kidneys, and nerves".  Thus, control of blood sugar, slows the progression of the corrosive effect of diabetes mellitus.       Explained the diagnoses, plan, and follow up with the patient and they expressed understanding.  Patient expressed understanding of the importance of proper follow up care.   Clent Demark Briawna Carver M.D. Diseases & Surgery of the Retina and Vitreous Retina & Diabetic [place] 09/28/19     Abbreviations: M myopia (nearsighted); A astigmatism; H hyperopia (farsighted); P presbyopia; Mrx spectacle prescription;  CTL contact lenses; OD right eye; OS left eye; OU both eyes  XT exotropia; ET esotropia; PEK punctate epithelial keratitis; PEE punctate epithelial erosions; DES dry eye syndrome; MGD meibomian gland dysfunction; ATs artificial tears; PFAT's preservative free artificial tears; [place] nuclear sclerotic cataract; PSC posterior subcapsular cataract; ERM epi-retinal membrane; PVD posterior vitreous detachment; RD retinal detachment; DM diabetes mellitus; DR diabetic retinopathy; NPDR non-proliferative diabetic retinopathy; PDR proliferative diabetic retinopathy; CSME clinically significant macular edema; DME diabetic macular edema; dbh dot blot hemorrhages; CWS cotton wool spot; POAG primary open angle glaucoma; C/D cup-to-disc ratio; HVF humphrey visual field; GVF goldmann visual field; OCT optical coherence tomography; IOP intraocular pressure; BRVO Branch retinal vein occlusion; CRVO central retinal vein occlusion; CRAO central  retinal artery occlusion; BRAO branch retinal artery occlusion; RT retinal tear; SB scleral buckle; PPV pars plana vitrectomy; VH Vitreous hemorrhage; PRP panretinal laser photocoagulation; IVK intravitreal kenalog; VMT vitreomacular  traction; MH Macular hole;  NVD neovascularization of the disc; NVE neovascularization elsewhere; AREDS age related eye disease study; ARMD age related macular degeneration; POAG primary open angle glaucoma; EBMD epithelial/anterior basement membrane dystrophy; ACIOL anterior chamber intraocular lens; IOL intraocular lens; PCIOL posterior chamber intraocular lens; Phaco/IOL phacoemulsification with intraocular lens placement; [place] photorefractive keratectomy; LASIK laser assisted in situ keratomileusis; HTN hypertension; DM diabetes mellitus; COPD chronic obstructive pulmonary disease

## 2019-09-28 NOTE — Assessment & Plan Note (Signed)
The nature of severe nonproliferative diabetic retinopathy discussed with the patient as well as the need for more frequent follow up and likely progression to proliferative disease in the near future. The options of continued observation versus panretinal photocoagulation at this time were reviewed as well as the risks, benefits, and alternatives. More recent option includes the use of ocular injectable medications to slow progression of retinal disease. Tight control of glucose, blood pressure, and serum lipid levels were recommended under the direction of general physician or endocrinologist, as well as avoidance of smoking and maintenance of normal body weight. The 2-year risk of progression to proliferative diabetic retinopathy is 60%.

## 2019-12-08 ENCOUNTER — Telehealth: Payer: Self-pay | Admitting: *Deleted

## 2019-12-08 ENCOUNTER — Other Ambulatory Visit: Payer: Self-pay | Admitting: Internal Medicine

## 2019-12-08 DIAGNOSIS — B20 Human immunodeficiency virus [HIV] disease: Secondary | ICD-10-CM

## 2019-12-08 DIAGNOSIS — Z113 Encounter for screening for infections with a predominantly sexual mode of transmission: Secondary | ICD-10-CM

## 2019-12-08 DIAGNOSIS — Z79899 Other long term (current) drug therapy: Secondary | ICD-10-CM

## 2019-12-08 NOTE — Addendum Note (Signed)
Addended by: Landis Gandy on: 12/08/2019 04:15 PM   Modules accepted: Orders

## 2019-12-08 NOTE — Telephone Encounter (Signed)
Received refill request for biktarvy. Patient last seen 10/2018, was supposed to follow up 12/21.  Last refill sent with message for patient to schedule an appointment, but he has not yet reached out to do so.  RN called phone number in chart, left message asking Fortune to please call his doctor's office for an appointment. RN also sent MyChart message. Landis Gandy, RN

## 2019-12-08 NOTE — Telephone Encounter (Signed)
Patietn returned call. RN explained, scheduled patient for 1 year appointment, sent 30 day supply biktarvy to the pharmacy.  Lab orders placed. Landis Gandy, RN

## 2019-12-18 ENCOUNTER — Other Ambulatory Visit: Payer: No Typology Code available for payment source

## 2019-12-18 ENCOUNTER — Other Ambulatory Visit: Payer: Self-pay

## 2019-12-18 ENCOUNTER — Other Ambulatory Visit (HOSPITAL_COMMUNITY)
Admission: RE | Admit: 2019-12-18 | Discharge: 2019-12-18 | Disposition: A | Discharge status: Home / Self Care | Payer: No Typology Code available for payment source | Source: Ambulatory Visit | Attending: Internal Medicine | Admitting: Internal Medicine

## 2019-12-18 DIAGNOSIS — B20 Human immunodeficiency virus [HIV] disease: Secondary | ICD-10-CM

## 2019-12-18 DIAGNOSIS — Z113 Encounter for screening for infections with a predominantly sexual mode of transmission: Secondary | ICD-10-CM | POA: Insufficient documentation

## 2019-12-18 DIAGNOSIS — Z79899 Other long term (current) drug therapy: Secondary | ICD-10-CM

## 2019-12-19 LAB — URINE CYTOLOGY ANCILLARY ONLY
Chlamydia: NEGATIVE
Comment: NEGATIVE
Comment: NORMAL
Neisseria Gonorrhea: NEGATIVE

## 2019-12-19 LAB — T-HELPER CELL (CD4) - (RCID CLINIC ONLY)
CD4 % Helper T Cell: 43 % (ref 33–65)
CD4 T Cell Abs: 758 /uL (ref 400–1790)

## 2019-12-20 LAB — CBC WITH DIFFERENTIAL/PLATELET
Absolute Monocytes: 248 cells/uL (ref 200–950)
Basophils Absolute: 32 cells/uL (ref 0–200)
Basophils Relative: 0.6 %
Eosinophils Absolute: 189 cells/uL (ref 15–500)
Eosinophils Relative: 3.5 %
HCT: 36.5 % — ABNORMAL LOW (ref 38.5–50.0)
Hemoglobin: 12.1 g/dL — ABNORMAL LOW (ref 13.2–17.1)
Lymphs Abs: 1912 cells/uL (ref 850–3900)
MCH: 31.5 pg (ref 27.0–33.0)
MCHC: 33.2 g/dL (ref 32.0–36.0)
MCV: 95.1 fL (ref 80.0–100.0)
MPV: 9.3 fL (ref 7.5–12.5)
Monocytes Relative: 4.6 %
Neutro Abs: 3019 cells/uL (ref 1500–7800)
Neutrophils Relative %: 55.9 %
Platelets: 314 10*3/uL (ref 140–400)
RBC: 3.84 10*6/uL — ABNORMAL LOW (ref 4.20–5.80)
RDW: 14.5 % (ref 11.0–15.0)
Total Lymphocyte: 35.4 %
WBC: 5.4 10*3/uL (ref 3.8–10.8)

## 2019-12-20 LAB — COMPLETE METABOLIC PANEL WITH GFR
AG Ratio: 1.6 (calc) (ref 1.0–2.5)
ALT: 11 U/L (ref 9–46)
AST: 11 U/L (ref 10–40)
Albumin: 4.3 g/dL (ref 3.6–5.1)
Alkaline phosphatase (APISO): 77 U/L (ref 36–130)
BUN/Creatinine Ratio: 8 (calc) (ref 6–22)
BUN: 41 mg/dL — ABNORMAL HIGH (ref 7–25)
CO2: 24 mmol/L (ref 20–32)
Calcium: 9.3 mg/dL (ref 8.6–10.3)
Chloride: 106 mmol/L (ref 98–110)
Creat: 5.37 mg/dL — ABNORMAL HIGH (ref 0.60–1.35)
GFR, Est African American: 15 mL/min/{1.73_m2} — ABNORMAL LOW (ref >=60)
GFR, Est Non African American: 13 mL/min/{1.73_m2} — ABNORMAL LOW (ref >=60)
Globulin: 2.7 g/dL (calc) (ref 1.9–3.7)
Glucose, Bld: 117 mg/dL — ABNORMAL HIGH (ref 65–99)
Potassium: 5.1 mmol/L (ref 3.5–5.3)
Sodium: 138 mmol/L (ref 135–146)
Total Bilirubin: 0.6 mg/dL (ref 0.2–1.2)
Total Protein: 7 g/dL (ref 6.1–8.1)

## 2019-12-20 LAB — LIPID PANEL
Cholesterol: 197 mg/dL (ref <200)
HDL: 50 mg/dL (ref >=40)
LDL Cholesterol (Calc): 126 mg/dL (calc) — ABNORMAL HIGH
Non-HDL Cholesterol (Calc): 147 mg/dL (calc) — ABNORMAL HIGH (ref <130)
Total CHOL/HDL Ratio: 3.9 (calc) (ref <5.0)
Triglycerides: 99 mg/dL (ref <150)

## 2019-12-20 LAB — HIV-1 RNA QUANT-NO REFLEX-BLD
HIV 1 RNA Quant: <20 Copies/mL
HIV-1 RNA Quant, Log: <1.3 Log cps/mL

## 2019-12-20 LAB — SYPHILIS: RPR W/REFLEX TO RPR TITER AND TREPONEMAL ANTIBODIES, TRADITIONAL SCREENING AND DIAGNOSIS ALGORITHM: RPR Ser Ql: NONREACTIVE

## 2020-01-01 ENCOUNTER — Ambulatory Visit: Payer: 59 | Admitting: Internal Medicine

## 2020-02-01 ENCOUNTER — Ambulatory Visit: Payer: No Typology Code available for payment source | Admitting: Internal Medicine

## 2020-02-19 ENCOUNTER — Other Ambulatory Visit: Payer: Self-pay | Admitting: Internal Medicine

## 2020-02-19 DIAGNOSIS — B20 Human immunodeficiency virus [HIV] disease: Secondary | ICD-10-CM

## 2020-02-29 ENCOUNTER — Encounter: Payer: Self-pay | Admitting: Internal Medicine

## 2020-02-29 ENCOUNTER — Ambulatory Visit (INDEPENDENT_AMBULATORY_CARE_PROVIDER_SITE_OTHER): Payer: No Typology Code available for payment source | Admitting: Internal Medicine

## 2020-02-29 VITALS — BP 131/86 | HR 80 | Temp 98.3°F | Ht 71.0 in | Wt 184.0 lb

## 2020-02-29 DIAGNOSIS — B20 Human immunodeficiency virus [HIV] disease: Secondary | ICD-10-CM | POA: Diagnosis not present

## 2020-02-29 DIAGNOSIS — N185 Chronic kidney disease, stage 5: Secondary | ICD-10-CM

## 2020-02-29 DIAGNOSIS — Z23 Encounter for immunization: Secondary | ICD-10-CM | POA: Diagnosis not present

## 2020-02-29 DIAGNOSIS — Z113 Encounter for screening for infections with a predominantly sexual mode of transmission: Secondary | ICD-10-CM

## 2020-02-29 NOTE — Assessment & Plan Note (Signed)
Going for transplant evaluation.  Followed by nephrology.

## 2020-02-29 NOTE — Assessment & Plan Note (Signed)
He continues to do well on Biktarvy and no issues.  Labs reassuring.  He can rtc in 1 year. He will be able to continue with Biktarvy alone while on dialysis as well, if and when that happens.

## 2020-02-29 NOTE — Progress Notes (Signed)
   Subjective:    Patient ID: Chase Rollins, male    DOB: 07/03/1980, 39 y.o.   MRN: [id number]  HPI Here for follow up of HIV. He has remained on Biktarvy and denies any recent missed doses.  CD4 758 and viral load < 20 copies. Has a stable partner and no new partners.  Here for his yearly follow up.  Not yet on dialysis.    Review of Systems  Constitutional: Negative for fatigue.  Gastrointestinal: Negative for nausea.  Skin: Negative for rash.       Objective:   Physical Exam Constitutional:      Appearance: He is well-developed.  HENT:     Mouth/Throat:     Pharynx: No oropharyngeal exudate.  Eyes:     General: No scleral icterus. Cardiovascular:     Rate and Rhythm: Normal rate and regular rhythm.     Heart sounds: Normal heart sounds. No murmur heard.   Pulmonary:     Effort: Pulmonary effort is normal. No respiratory distress.     Breath sounds: Normal breath sounds.  Skin:    Findings: No rash.  Neurological:     Mental Status: He is alert.    SH: no tobacco       Assessment & Plan:

## 2020-02-29 NOTE — Progress Notes (Signed)
Looks like it has been removed will keep a chedk

## 2020-02-29 NOTE — Assessment & Plan Note (Signed)
Screened negative 

## 2020-03-06 ENCOUNTER — Other Ambulatory Visit: Payer: Self-pay | Admitting: Internal Medicine

## 2020-03-06 DIAGNOSIS — B20 Human immunodeficiency virus [HIV] disease: Secondary | ICD-10-CM

## 2020-04-01 ENCOUNTER — Other Ambulatory Visit: Payer: Self-pay

## 2020-04-01 ENCOUNTER — Ambulatory Visit (INDEPENDENT_AMBULATORY_CARE_PROVIDER_SITE_OTHER): Payer: No Typology Code available for payment source | Admitting: Ophthalmology

## 2020-04-01 ENCOUNTER — Encounter (INDEPENDENT_AMBULATORY_CARE_PROVIDER_SITE_OTHER): Payer: Self-pay | Admitting: Ophthalmology

## 2020-04-01 DIAGNOSIS — E103492 Type 1 diabetes mellitus with severe nonproliferative diabetic retinopathy without macular edema, left eye: Secondary | ICD-10-CM | POA: Diagnosis not present

## 2020-04-01 DIAGNOSIS — E103491 Type 1 diabetes mellitus with severe nonproliferative diabetic retinopathy without macular edema, right eye: Secondary | ICD-10-CM

## 2020-04-01 DIAGNOSIS — H2513 Age-related nuclear cataract, bilateral: Secondary | ICD-10-CM | POA: Diagnosis not present

## 2020-04-01 LAB — HM DIABETES EYE EXAM

## 2020-04-01 NOTE — Assessment & Plan Note (Signed)
Mild OU, not visually significant

## 2020-04-01 NOTE — Progress Notes (Signed)
04/01/2020     CHIEF COMPLAINT Patient presents for Retina Follow Up   HISTORY OF PRESENT ILLNESS: Chase Rollins is a 39 y.o. male who presents to the clinic today for:   HPI    Retina Follow Up    Patient presents with  Diabetic Retinopathy.  In both eyes.  Severity is severe.  Duration of 6 months.  Since onset it is stable.  I, the attending physician,  performed the HPI with the patient and updated documentation appropriately.          Comments    6 Month NPDR f\u OU. FP  Pt states no changes in vision. Denies FOL and floaters. BGL: 86 A1C: 7's       Last edited by Tilda Franco on 04/01/2020  3:53 PM. (History)      Referring physician: Rita Ohara, [place],  [place] [id number]  HISTORICAL INFORMATION:   Selected notes from the MEDICAL RECORD NUMBER    Lab Results  Component Value Date   HGBA1C 6.2 (A) 07/12/2018     CURRENT MEDICATIONS: Current Outpatient Medications (Ophthalmic Drugs)  Medication Sig  . TOBRADEX ophthalmic ointment APPLY INTO RIGHT EYE 3 TIMES A DAY (Patient not taking: Reported on 02/29/2020)   No current facility-administered medications for this visit. (Ophthalmic Drugs)   Current Outpatient Medications (Other)  Medication Sig  . acetaminophen (TYLENOL) 500 MG tablet Take 1,000 mg by mouth every 6 (six) hours as needed.  Marland Kitchen amLODipine (NORVASC) 10 MG tablet Take 10 mg by mouth at bedtime.  Marland Kitchen atorvastatin (LIPITOR) 10 MG tablet TAKE 1 TABLET (10 MG TOTAL) BY MOUTH ONCE DAILY AT 6 PM. (Patient not taking: Reported on 02/29/2020)  . BIKTARVY 50-200-25 MG TABS tablet TAKE 1 TABLET BY MOUTH EVERY DAY  . bismuth subsalicylate (PEPTO BISMOL) 262 MG/15ML suspension Take 30 mLs by mouth every 6 (six) hours as needed.  . calcitRIOL (ROCALTROL) 0.25 MCG capsule Take 0.25 mcg by mouth daily.   . calcium carbonate (TUMS - DOSED IN MG ELEMENTAL CALCIUM) 500 MG chewable tablet Chew 1 tablet by mouth daily.  Marland Kitchen  dicyclomine (BENTYL) 10 MG capsule Take 10 mg by mouth every 6 (six) hours as needed. (Patient not taking: Reported on 02/29/2020)  . furosemide (LASIX) 40 MG tablet Take 40 mg by mouth daily.  . hydrALAZINE (APRESOLINE) 25 MG tablet Take 25 mg by mouth 2 (two) times daily.  . Insulin Glargine (BASAGLAR KWIKPEN) 100 UNIT/ML SOPN Inject 0.32 mLs (32 Units total) into the skin every morning.  Marland Kitchen lisinopril (ZESTRIL) 40 MG tablet Take 40 mg by mouth at bedtime. (Patient not taking: Reported on 02/29/2020)  . metoprolol tartrate (LOPRESSOR) 50 MG tablet Take 50 mg by mouth 2 (two) times daily.  . ondansetron (ZOFRAN-ODT) 4 MG disintegrating tablet Take 4 mg by mouth every 8 (eight) hours as needed.  (Patient not taking: Reported on 02/29/2020)  . sodium bicarbonate 650 MG tablet 3 (THREE) TABLET TWO TIMES DAILY   No current facility-administered medications for this visit. (Other)      REVIEW OF SYSTEMS: ROS    Positive for: Endocrine   Last edited by Tilda Franco on 04/01/2020  3:53 PM. (History)       ALLERGIES No Known Allergies  PAST MEDICAL HISTORY Past Medical History:  Diagnosis Date  . Anemia   . Anemia associated with chronic renal failure   . Chronic kidney disease    Stage IV  . Diabetes (  [place])   . HIV (human immunodeficiency virus infection) ([place])   . HTN (hypertension)    Past Surgical History:  Procedure Laterality Date  . NO PAST SURGERIES      FAMILY HISTORY Family History  Problem Relation Age of Onset  . Diabetes Mother   . Hypertension Mother   . Diabetes Paternal Grandmother   . Hypertension Father   . Heart Problems Paternal Aunt     SOCIAL HISTORY Social History   Tobacco Use  . Smoking status: Never Smoker  . Smokeless tobacco: Never Used  Vaping Use  . Vaping Use: Never used  Substance Use Topics  . Alcohol use: Yes    Comment: socially  . Drug use: No         OPHTHALMIC EXAM: Base Eye Exam    Visual Acuity (Snellen -  Linear)      Right Left   Dist [place] 20/20 20/20 -2       Tonometry (Tonopen, 3:57 PM)      Right Left   Pressure 18 20       Pupils      Pupils Dark Light Shape React APD   Right PERRL 3 3 Round Minimal None   Left PERRL 3 3 Round Minimal None       Visual Fields (Counting fingers)      Left Right    Full Full       Neuro/Psych    Oriented x3: Yes   Mood/Affect: Normal       Dilation    Both eyes: 1.0% Mydriacyl, 2.5% Phenylephrine @ 3:57 PM        Slit Lamp and Fundus Exam    External Exam      Right Left   External Normal Normal       Slit Lamp Exam      Right Left   Lids/Lashes Normal Normal   Conjunctiva/Sclera White and quiet White and quiet   Cornea Clear Clear   Anterior Chamber Deep and quiet Deep and quiet   Iris Round and reactive Round and reactive   Lens 1+ Nuclear sclerosis 1+ Nuclear sclerosis   Anterior Vitreous Normal Normal       Fundus Exam      Right Left   Posterior Vitreous Normal Normal   Disc Normal Normal   C/D Ratio 0.2 0.2   Macula Microaneurysms, no macular thickening, Exudates, no clinically significant macular edema Exudates, Microaneurysms, no macular thickening, no clinically significant macular edema   Vessels NPDR severe NPDR severe   Periphery Normal Moderate scatter pattern PRP inferiorly          IMAGING AND PROCEDURES  Imaging and Procedures for 04/01/20  Color Fundus Photography Optos - OU - Both Eyes       Right Eye Progression has been stable. Macula : microaneurysms, exudates. Vessels : IRMA.   Left Eye Progression has been stable. Disc findings include normal observations. Macula : exudates, microaneurysms.   Notes Moderate severe nonproliferative diabetic retinopathy right eye  OS, severe nonproliferative diabetic retinopathy with good PRP moderate scatter pattern inferiorly                ASSESSMENT/PLAN:  Nuclear sclerotic cataract of both eyes Mild OU, not visually  significant  Severe nonproliferative diabetic retinopathy of left eye, without macular edema, associated with type 1 diabetes mellitus (HCC) Status post PRP #1 2 slow progression of severe NPDR  Severe nonproliferative diabetic retinopathy of right eye, without macular edema,  associated with type 1 diabetes mellitus (HCC) The nature of severe nonproliferative diabetic retinopathy discussed with the patient as well as the need for more frequent follow up and likely progression to proliferative disease in the near future. The options of continued observation versus panretinal photocoagulation at this time were reviewed as well as the risks, benefits, and alternatives. More recent option includes the use of ocular injectable medications to slow progression of retinal disease. Tight control of glucose, blood pressure, and serum lipid levels were recommended under the direction of general physician or endocrinologist, as well as avoidance of smoking and maintenance of normal body weight. The 2-year risk of progression to proliferative diabetic retinopathy is 60%.  With type 1 diabetes and with careful clinical evaluation there is significant peripheral capillary dropout, retinal nonperfusion at an anterior to the equator OU.  Left eye is already post PRP #1 and will observe  Right eye will deliver anterior PRP inferiorly and temporally simply to moderate progression to PDR      ICD-10-CM   1. Severe nonproliferative diabetic retinopathy of right eye, without macular edema, associated with type 1 diabetes mellitus (HCC)  J19.4174 Color Fundus Photography Optos - OU - Both Eyes  2. Severe nonproliferative diabetic retinopathy of left eye, without macular edema, associated with type 1 diabetes mellitus (HCC)  Y81.4481 Color Fundus Photography Optos - OU - Both Eyes  3. Nuclear sclerotic cataract of both eyes  H25.13     1.  OS post PRP #1 to slow, moderate progression of severe NPDR with capillary  nonperfusion to prevent PDR  2.  OD, next visit will deliver anterior PRP temporally and inferiorly to prevent progression to PDR  3.  Ophthalmic Meds Ordered this visit:  No orders of the defined types were placed in this encounter.      Return in about 6 months (around 09/29/2020) for dilate, OD, PRP, COLOR FP.  There are no Patient Instructions on file for this visit.   Explained the diagnoses, plan, and follow up with the patient and they expressed understanding.  Patient expressed understanding of the importance of proper follow up care.   Clent Demark Jone Panebianco M.D. Diseases & Surgery of the Retina and Vitreous Retina & Diabetic [place] 04/01/20     Abbreviations: M myopia (nearsighted); A astigmatism; H hyperopia (farsighted); P presbyopia; Mrx spectacle prescription;  CTL contact lenses; OD right eye; OS left eye; OU both eyes  XT exotropia; ET esotropia; PEK punctate epithelial keratitis; PEE punctate epithelial erosions; DES dry eye syndrome; MGD meibomian gland dysfunction; ATs artificial tears; PFAT's preservative free artificial tears; [place] nuclear sclerotic cataract; PSC posterior subcapsular cataract; ERM epi-retinal membrane; PVD posterior vitreous detachment; RD retinal detachment; DM diabetes mellitus; DR diabetic retinopathy; NPDR non-proliferative diabetic retinopathy; PDR proliferative diabetic retinopathy; CSME clinically significant macular edema; DME diabetic macular edema; dbh dot blot hemorrhages; CWS cotton wool spot; POAG primary open angle glaucoma; C/D cup-to-disc ratio; HVF humphrey visual field; GVF goldmann visual field; OCT optical coherence tomography; IOP intraocular pressure; BRVO Branch retinal vein occlusion; CRVO central retinal vein occlusion; CRAO central retinal artery occlusion; BRAO branch retinal artery occlusion; RT retinal tear; SB scleral buckle; PPV pars plana vitrectomy; VH Vitreous hemorrhage; PRP panretinal laser photocoagulation; IVK  intravitreal kenalog; VMT vitreomacular traction; MH Macular hole;  NVD neovascularization of the disc; NVE neovascularization elsewhere; AREDS age related eye disease study; ARMD age related macular degeneration; POAG primary open angle glaucoma; EBMD epithelial/anterior basement membrane dystrophy; ACIOL anterior chamber intraocular lens;  IOL intraocular lens; PCIOL posterior chamber intraocular lens; Phaco/IOL phacoemulsification with intraocular lens placement; [place] photorefractive keratectomy; LASIK laser assisted in situ keratomileusis; HTN hypertension; DM diabetes mellitus; COPD chronic obstructive pulmonary disease

## 2020-04-01 NOTE — Assessment & Plan Note (Signed)
The nature of severe nonproliferative diabetic retinopathy discussed with the patient as well as the need for more frequent follow up and likely progression to proliferative disease in the near future. The options of continued observation versus panretinal photocoagulation at this time were reviewed as well as the risks, benefits, and alternatives. More recent option includes the use of ocular injectable medications to slow progression of retinal disease. Tight control of glucose, blood pressure, and serum lipid levels were recommended under the direction of general physician or endocrinologist, as well as avoidance of smoking and maintenance of normal body weight. The 2-year risk of progression to proliferative diabetic retinopathy is 60%.  With type 1 diabetes and with careful clinical evaluation there is significant peripheral capillary dropout, retinal nonperfusion at an anterior to the equator OU.  Left eye is already post PRP #1 and will observe  Right eye will deliver anterior PRP inferiorly and temporally simply to moderate progression to PDR

## 2020-04-01 NOTE — Assessment & Plan Note (Signed)
Status post PRP #1 2 slow progression of severe NPDR

## 2020-04-22 ENCOUNTER — Other Ambulatory Visit: Payer: Self-pay

## 2020-04-22 ENCOUNTER — Telehealth: Payer: Self-pay

## 2020-04-22 DIAGNOSIS — B20 Human immunodeficiency virus [HIV] disease: Secondary | ICD-10-CM

## 2020-04-22 MED ORDER — BIKTARVY 50-200-25 MG PO TABS: 1.0000 | ORAL_TABLET | Freq: Every day | ORAL | 1 refills | Status: DC

## 2020-04-22 NOTE — Telephone Encounter (Signed)
Call from [facility] requesting to switch patient's Biktarvy to a 3 month supply. Will route to provider.   Beryle Flock, RN

## 2020-04-22 NOTE — Telephone Encounter (Signed)
Sent 3 month supply with one refill of Biktarvy to [facility] per Dr. Linus Salmons.   Beryle Flock, RN

## 2020-04-22 NOTE — Telephone Encounter (Signed)
Great, yes.  Ok to do.

## 2020-05-29 ENCOUNTER — Other Ambulatory Visit: Payer: Self-pay | Admitting: *Deleted

## 2020-05-29 DIAGNOSIS — N185 Chronic kidney disease, stage 5: Secondary | ICD-10-CM

## 2020-06-11 ENCOUNTER — Ambulatory Visit (INDEPENDENT_AMBULATORY_CARE_PROVIDER_SITE_OTHER)
Admission: RE | Admit: 2020-06-11 | Discharge: 2020-06-11 | Disposition: A | Discharge status: Home / Self Care | Payer: No Typology Code available for payment source | Source: Ambulatory Visit | Attending: Vascular Surgery | Admitting: Vascular Surgery

## 2020-06-11 ENCOUNTER — Ambulatory Visit (HOSPITAL_COMMUNITY)
Admission: RE | Admit: 2020-06-11 | Discharge: 2020-06-11 | Disposition: A | Discharge status: Home / Self Care | Payer: No Typology Code available for payment source | Source: Ambulatory Visit | Attending: Vascular Surgery | Admitting: Vascular Surgery

## 2020-06-11 ENCOUNTER — Other Ambulatory Visit: Payer: Self-pay

## 2020-06-11 ENCOUNTER — Ambulatory Visit (INDEPENDENT_AMBULATORY_CARE_PROVIDER_SITE_OTHER): Payer: No Typology Code available for payment source | Admitting: Vascular Surgery

## 2020-06-11 ENCOUNTER — Encounter: Payer: Self-pay | Admitting: Vascular Surgery

## 2020-06-11 VITALS — BP 134/76 | HR 80 | Temp 98.5°F | Resp 16 | Ht 71.0 in | Wt 183.0 lb

## 2020-06-11 DIAGNOSIS — N185 Chronic kidney disease, stage 5: Secondary | ICD-10-CM

## 2020-06-11 NOTE — Progress Notes (Signed)
Patient name: Delancey Talamo III MRN: [id number] DOB: 04/22/1981 Sex: male  REASON FOR CONSULT: Discuss permanent dialysis access  HPI: Asaph Cea III is a 40 y.o. male, with history of diabetes, hypertension, HIV, stage 5 CKD that presents for re-evaluation for new dialysis access. He was initially seen on 06/28/2018 for AV fistula access evaluation and had no usable surface vein and ultimately elected to delay placement given likely need for AV graft and he was not on dialysis at the time. Still left-handed. 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 No previous upper extremity access. Still prefers right arm if possible. No chest wall implants.  Past Medical History:  Diagnosis Date  . Anemia   . Anemia associated with chronic renal failure   . Chronic kidney disease    Stage IV  . Diabetes ([place])   . HIV (human immunodeficiency virus infection) ([place])   . HTN (hypertension)     Past Surgical History:  Procedure Laterality Date  . NO PAST SURGERIES      Family History  Problem Relation Age of Onset  . Diabetes Mother   . Hypertension Mother   . Diabetes Paternal Grandmother   . Hypertension Father   . Heart Problems Paternal Aunt     SOCIAL HISTORY: Social History   Socioeconomic History  . Marital status: Married    Spouse name: Not on file  . Number of children: Not on file  . Years of education: Not on file  . Highest education level: Not on file  Occupational History  . Not on file  Tobacco Use  . Smoking status: Never Smoker  . Smokeless tobacco: Never Used  Vaping Use  . Vaping Use: Never used  Substance and Sexual Activity  . Alcohol use: Yes    Comment: socially  . Drug use: No  . Sexual activity: Yes    Partners: Male    Comment: pt declined condoms  Other Topics Concern  . Not on file  Social History Narrative  . Not on file   Social Determinants of Health   Financial Resource Strain: Not on file  Food Insecurity: Not on file   Transportation Needs: Not on file  Physical Activity: Not on file  Stress: Not on file  Social Connections: Not on file  Intimate Partner Violence: Not on file    No Known Allergies  Current Outpatient Medications  Medication Sig Dispense Refill  . acetaminophen (TYLENOL) 500 MG tablet Take 1,000 mg by mouth every 6 (six) hours as needed.    Marland Kitchen amLODipine (NORVASC) 10 MG tablet Take 10 mg by mouth at bedtime.    Marland Kitchen atorvastatin (LIPITOR) 10 MG tablet TAKE 1 TABLET (10 MG TOTAL) BY MOUTH ONCE DAILY AT 6 PM. 30 tablet 0  . bictegravir-emtricitabine-tenofovir AF (BIKTARVY) 50-200-25 MG TABS tablet Take 1 tablet by mouth daily. 90 tablet 1  . furosemide (LASIX) 40 MG tablet Take 40 mg by mouth daily.    . hydrALAZINE (APRESOLINE) 25 MG tablet Take 25 mg by mouth 2 (two) times daily.    . Insulin Glargine (BASAGLAR KWIKPEN) 100 UNIT/ML SOPN Inject 0.32 mLs (32 Units total) into the skin every morning. 45 mL 2  . metoprolol tartrate (LOPRESSOR) 50 MG tablet Take 50 mg by mouth 2 (two) times daily.    . sodium bicarbonate 650 MG tablet 3 (THREE) TABLET TWO TIMES DAILY    . bismuth subsalicylate (PEPTO BISMOL) 262 MG/15ML suspension Take 30 mLs by mouth every 6 (six) hours  as needed. (Patient not taking: Reported on 06/11/2020)    . calcitRIOL (ROCALTROL) 0.25 MCG capsule Take 0.25 mcg by mouth daily.   6  . calcium carbonate (TUMS - DOSED IN MG ELEMENTAL CALCIUM) 500 MG chewable tablet Chew 1 tablet by mouth daily. (Patient not taking: Reported on 06/11/2020)    . dicyclomine (BENTYL) 10 MG capsule Take 10 mg by mouth every 6 (six) hours as needed. (Patient not taking: No sig reported)    . lisinopril (ZESTRIL) 40 MG tablet Take 40 mg by mouth at bedtime. (Patient not taking: Reported on 02/29/2020)    . ondansetron (ZOFRAN-ODT) 4 MG disintegrating tablet Take 4 mg by mouth every 8 (eight) hours as needed.  (Patient not taking: No sig reported)    . TOBRADEX ophthalmic ointment APPLY INTO RIGHT EYE  3 TIMES A DAY (Patient not taking: No sig reported)     No current facility-administered medications for this visit.    REVIEW OF SYSTEMS:  '[X]'$  denotes positive finding, '[ ]'$  denotes negative finding Cardiac  Comments:  Chest pain or chest pressure:    Shortness of breath upon exertion:    Short of breath when lying flat:    Irregular heart rhythm:        Vascular    Pain in calf, thigh, or hip brought on by ambulation:    Pain in feet at night that wakes you up from your sleep:     Blood clot in your veins:    Leg swelling:         Pulmonary    Oxygen at home:    Productive cough:     Wheezing:         Neurologic    Sudden weakness in arms or legs:     Sudden numbness in arms or legs:     Sudden onset of difficulty speaking or slurred speech:    Temporary loss of vision in one eye:     Problems with dizziness:         Gastrointestinal    Blood in stool:     Vomited blood:         Genitourinary    Burning when urinating:     Blood in urine:        Psychiatric    Major depression:         Hematologic    Bleeding problems:    Problems with blood clotting too easily:        Skin    Rashes or ulcers:        Constitutional    Fever or chills:      PHYSICAL EXAM: Vitals:   06/11/20 1132  BP: 134/76  Pulse: 80  Resp: 16  Temp: 98.5 F (36.9 C)  TempSrc: Temporal  SpO2: 96%  Weight: 183 lb (83 kg)  Height: '5\' 11"'$  (1.803 m)    GENERAL: The patient is a well-nourished male, in no acute distress. The vital signs are documented above. CARDIAC: There is a regular rate and rhythm.  VASCULAR:  2+ radial pulse palpable bilateral upper extremities 2+ brachial pulse palpable bilateral upper extremities No chest wall implants PULMONARY: No respiratory distress. ABDOMEN: Soft and non-tender.  MUSCULOSKELETAL: There are no major deformities or cyanosis. NEUROLOGIC: No focal weakness or paresthesias are detected.   DATA:   Vein mapping today would suggest a  borderline right cephalic vein at the antecubital fossa and also a left cephalic vein above the antecubital fossa  Assessment/Plan:  40 year old male with stage V chronic kidney disease that needs permanent dialysis access. He would prefer access in the right arm and it looks like he had a borderline cephalic vein on the right on vein mapping today, but I looked at this myself with the sonosite [place] in the office and this still appears very small and likely would require AV graft. The left cephalic vein does look to be 3 mm at the antecubital fossa and more usable on my evaluation.  He is amendable to left arm AV fistula even though it is his dominant arm. I discussed other option would be right arm but likely would be AV graft. He would like to schedule for late February. Risk benefits discussed in detail including bleeding, infection, failure to mature, risk of steal, etc..   Marty Heck, MD [facility] Office: [place]

## 2020-06-12 ENCOUNTER — Other Ambulatory Visit: Payer: Self-pay

## 2020-07-11 ENCOUNTER — Encounter (HOSPITAL_COMMUNITY): Payer: Self-pay | Admitting: Vascular Surgery

## 2020-07-11 ENCOUNTER — Other Ambulatory Visit: Payer: Self-pay

## 2020-07-11 ENCOUNTER — Other Ambulatory Visit (HOSPITAL_COMMUNITY)
Admission: RE | Admit: 2020-07-11 | Discharge: 2020-07-11 | Disposition: A | Discharge status: Home / Self Care | Payer: No Typology Code available for payment source | Source: Ambulatory Visit | Attending: Vascular Surgery | Admitting: Vascular Surgery

## 2020-07-11 DIAGNOSIS — Z01812 Encounter for preprocedural laboratory examination: Secondary | ICD-10-CM | POA: Insufficient documentation

## 2020-07-11 DIAGNOSIS — Z20822 Contact with and (suspected) exposure to covid-19: Secondary | ICD-10-CM | POA: Insufficient documentation

## 2020-07-11 LAB — SARS CORONAVIRUS 2 (TAT 6-24 HRS): SARS Coronavirus 2: NEGATIVE

## 2020-07-11 NOTE — Progress Notes (Signed)
Mr. Chase Rollins denies chest pain or shortness of breath. Patient tested negative for Covid today and has been in quarantine since that time.  Mr Chase Rollins has type I diabetes per Dr. Loanne Drilling patient's endocrinologist. Patient takes Basaglar Insulin only. I instructed Mr. Chase Rollins to take 25 units of Basaglar in am if CBG > 70. I instructed patient to check CBG after awaking and every 2 hours until arrival  to the hospital.  I Instructed patient if CBG is less than 70 to take 4 Glucose Tablets or 1 tube of Glucose Gel or 1/2 cup of a clear juice. Recheck CBG in 15 minutes then call pre- op desk at [phone] for further instructions. If scheduled to receive Insulin, do not take Insulin

## 2020-07-11 NOTE — Anesthesia Preprocedure Evaluation (Addendum)
Anesthesia Evaluation  Patient identified by MRN, date of birth, ID band Patient awake    Reviewed: Allergy & Precautions, NPO status , Patient's Chart, lab work & pertinent test results  Airway Mallampati: II  TM Distance: >3 FB     Dental   Pulmonary pneumonia,    breath sounds clear to auscultation       Cardiovascular hypertension,  Rhythm:Regular Rate:Normal     Neuro/Psych negative neurological ROS     GI/Hepatic negative GI ROS, Neg liver ROS,   Endo/Other  diabetes  Renal/GU Renal disease     Musculoskeletal   Abdominal   Peds  Hematology   Anesthesia Other Findings   Reproductive/Obstetrics                            Anesthesia Physical Anesthesia Plan  ASA: III  Anesthesia Plan: General   Post-op Pain Management:    Induction: Intravenous  PONV Risk Score and Plan: 2 and Ondansetron, Dexamethasone and Midazolam  Airway Management Planned: LMA  Additional Equipment:   Intra-op Plan:   Post-operative Plan: Extubation in OR  Informed Consent: I have reviewed the patients History and Physical, chart, labs and discussed the procedure including the risks, benefits and alternatives for the proposed anesthesia with the patient or authorized representative who has indicated his/her understanding and acceptance.     Dental advisory given  Plan Discussed with: CRNA and Anesthesiologist  Anesthesia Plan Comments:        Anesthesia Quick Evaluation

## 2020-07-12 ENCOUNTER — Ambulatory Visit (HOSPITAL_COMMUNITY): Payer: No Typology Code available for payment source | Admitting: Anesthesiology

## 2020-07-12 ENCOUNTER — Encounter (HOSPITAL_COMMUNITY)
Admission: RE | Disposition: A | Discharge status: Home / Self Care | Payer: Self-pay | Source: Home / Self Care | Attending: Vascular Surgery

## 2020-07-12 ENCOUNTER — Encounter (HOSPITAL_COMMUNITY): Payer: Self-pay | Admitting: Vascular Surgery

## 2020-07-12 ENCOUNTER — Ambulatory Visit (HOSPITAL_COMMUNITY)
Admission: RE | Admit: 2020-07-12 | Discharge: 2020-07-12 | Disposition: A | Discharge status: Home / Self Care | Payer: No Typology Code available for payment source | Attending: Vascular Surgery | Admitting: Vascular Surgery

## 2020-07-12 DIAGNOSIS — N185 Chronic kidney disease, stage 5: Secondary | ICD-10-CM | POA: Insufficient documentation

## 2020-07-12 DIAGNOSIS — I120 Hypertensive chronic kidney disease with stage 5 chronic kidney disease or end stage renal disease: Secondary | ICD-10-CM | POA: Insufficient documentation

## 2020-07-12 DIAGNOSIS — N189 Chronic kidney disease, unspecified: Secondary | ICD-10-CM

## 2020-07-12 DIAGNOSIS — E1122 Type 2 diabetes mellitus with diabetic chronic kidney disease: Secondary | ICD-10-CM | POA: Insufficient documentation

## 2020-07-12 HISTORY — DX: Pneumonia, unspecified organism: J18.9

## 2020-07-12 HISTORY — PX: AV FISTULA PLACEMENT: SHX1204

## 2020-07-12 LAB — POCT I-STAT, CHEM 8
BUN: 35 mg/dL — ABNORMAL HIGH (ref 6–20)
Calcium, Ion: 1.18 mmol/L (ref 1.15–1.40)
Chloride: 105 mmol/L (ref 98–111)
Creatinine, Ser: 6 mg/dL — ABNORMAL HIGH (ref 0.61–1.24)
Glucose, Bld: 141 mg/dL — ABNORMAL HIGH (ref 70–99)
HCT: 34 % — ABNORMAL LOW (ref 39.0–52.0)
Hemoglobin: 11.6 g/dL — ABNORMAL LOW (ref 13.0–17.0)
Potassium: 4.3 mmol/L (ref 3.5–5.1)
Sodium: 140 mmol/L (ref 135–145)
TCO2: 25 mmol/L (ref 22–32)

## 2020-07-12 LAB — GLUCOSE, CAPILLARY
Glucose-Capillary: 163 mg/dL — ABNORMAL HIGH (ref 70–99)
Glucose-Capillary: 82 mg/dL (ref 70–99)

## 2020-07-12 SURGERY — ARTERIOVENOUS (AV) FISTULA CREATION
Anesthesia: General | Site: Arm Upper | Laterality: Left

## 2020-07-12 MED ORDER — MIDAZOLAM HCL 2 MG/2ML IJ SOLN
INTRAMUSCULAR | Status: AC
  Filled 2020-07-12: qty 2

## 2020-07-12 MED ORDER — FENTANYL CITRATE (PF) 250 MCG/5ML IJ SOLN
INTRAMUSCULAR | Status: AC
  Filled 2020-07-12: qty 5

## 2020-07-12 MED ORDER — PAPAVERINE HCL 30 MG/ML IJ SOLN
INTRAMUSCULAR | Status: AC
  Filled 2020-07-12: qty 2

## 2020-07-12 MED ORDER — SODIUM CHLORIDE 0.9 % IV SOLN: INTRAVENOUS | Status: DC

## 2020-07-12 MED ORDER — PHENYLEPHRINE 40 MCG/ML (10ML) SYRINGE FOR IV PUSH (FOR BLOOD PRESSURE SUPPORT)
PREFILLED_SYRINGE | INTRAVENOUS | Status: DC | PRN
  Administered 2020-07-12 (×3): 80 ug via INTRAVENOUS

## 2020-07-12 MED ORDER — MIDAZOLAM HCL 5 MG/5ML IJ SOLN
INTRAMUSCULAR | Status: DC | PRN
  Administered 2020-07-12: 2 mg via INTRAVENOUS

## 2020-07-12 MED ORDER — EPHEDRINE SULFATE 50 MG/ML IJ SOLN
INTRAMUSCULAR | Status: DC | PRN
  Administered 2020-07-12 (×2): 5 mg via INTRAVENOUS
  Administered 2020-07-12: 10 mg via INTRAVENOUS
  Administered 2020-07-12: 5 mg via INTRAVENOUS

## 2020-07-12 MED ORDER — CHLORHEXIDINE GLUCONATE 0.12 % MT SOLN
OROMUCOSAL | Status: AC
  Administered 2020-07-12: 15 mL
  Filled 2020-07-12: qty 15

## 2020-07-12 MED ORDER — CEFAZOLIN SODIUM-DEXTROSE 2-4 GM/100ML-% IV SOLN
2.0000 g | INTRAVENOUS | Status: AC
  Administered 2020-07-12: 2 g via INTRAVENOUS
  Filled 2020-07-12: qty 100

## 2020-07-12 MED ORDER — ONDANSETRON HCL 4 MG/2ML IJ SOLN
INTRAMUSCULAR | Status: DC | PRN
  Administered 2020-07-12: 4 mg via INTRAVENOUS

## 2020-07-12 MED ORDER — CHLORHEXIDINE GLUCONATE 4 % EX LIQD: 60.0000 mL | Freq: Once | CUTANEOUS | Status: DC

## 2020-07-12 MED ORDER — DEXAMETHASONE SODIUM PHOSPHATE 10 MG/ML IJ SOLN
INTRAMUSCULAR | Status: DC | PRN
  Administered 2020-07-12: 5 mg via INTRAVENOUS

## 2020-07-12 MED ORDER — FENTANYL CITRATE (PF) 250 MCG/5ML IJ SOLN
INTRAMUSCULAR | Status: DC | PRN
  Administered 2020-07-12: 25 ug via INTRAVENOUS
  Administered 2020-07-12: 50 ug via INTRAVENOUS
  Administered 2020-07-12: 25 ug via INTRAVENOUS

## 2020-07-12 MED ORDER — HYDROCODONE-ACETAMINOPHEN 5-325 MG PO TABS: 1.0000 | ORAL_TABLET | ORAL | 0 refills | Status: DC | PRN

## 2020-07-12 MED ORDER — PROPOFOL 10 MG/ML IV BOLUS
INTRAVENOUS | Status: DC | PRN
  Administered 2020-07-12: 160 mg via INTRAVENOUS
  Administered 2020-07-12: 40 mg via INTRAVENOUS

## 2020-07-12 MED ORDER — SODIUM CHLORIDE 0.9 % IV SOLN
INTRAVENOUS | Status: AC
  Filled 2020-07-12: qty 1.2

## 2020-07-12 MED ORDER — FENTANYL CITRATE (PF) 100 MCG/2ML IJ SOLN: 25.0000 ug | INTRAMUSCULAR | Status: DC | PRN

## 2020-07-12 MED ORDER — LIDOCAINE-EPINEPHRINE (PF) 1 %-1:200000 IJ SOLN
INTRAMUSCULAR | Status: AC
  Filled 2020-07-12: qty 30

## 2020-07-12 MED ORDER — 0.9 % SODIUM CHLORIDE (POUR BTL) OPTIME
TOPICAL | Status: DC | PRN
  Administered 2020-07-12: 1000 mL

## 2020-07-12 MED ORDER — SODIUM CHLORIDE 0.9 % IV SOLN
INTRAVENOUS | Status: DC | PRN
  Administered 2020-07-12: 500 mL

## 2020-07-12 MED ORDER — PROPOFOL 10 MG/ML IV BOLUS
INTRAVENOUS | Status: AC
  Filled 2020-07-12: qty 40

## 2020-07-12 MED ORDER — LIDOCAINE 2% (20 MG/ML) 5 ML SYRINGE
INTRAMUSCULAR | Status: DC | PRN
  Administered 2020-07-12: 100 mg via INTRAVENOUS

## 2020-07-12 SURGICAL SUPPLY — 37 items
ADH SKN CLS APL DERMABOND .7 (GAUZE/BANDAGES/DRESSINGS) ×1
APL PRP STRL LF DISP 70% ISPRP (MISCELLANEOUS) ×1
APL SKNCLS STERI-STRIP NONHPOA (GAUZE/BANDAGES/DRESSINGS) ×1
ARMBAND PINK RESTRICT EXTREMIT (MISCELLANEOUS) ×2 IMPLANT
BENZOIN TINCTURE PRP APPL 2/3 (GAUZE/BANDAGES/DRESSINGS) ×2 IMPLANT
CANISTER SUCT 3000ML PPV (MISCELLANEOUS) ×2 IMPLANT
CANNULA VESSEL 3MM 2 BLNT TIP (CANNULA) ×2 IMPLANT
CHLORAPREP W/TINT 26 (MISCELLANEOUS) ×2 IMPLANT
CLIP VESOCCLUDE MED 6/CT (CLIP) ×2 IMPLANT
CLIP VESOCCLUDE SM WIDE 6/CT (CLIP) ×4 IMPLANT
COVER PROBE W GEL 5X96 (DRAPES) IMPLANT
COVER WAND RF STERILE (DRAPES) ×2 IMPLANT
DERMABOND ADVANCED (GAUZE/BANDAGES/DRESSINGS) ×1
DERMABOND ADVANCED .7 DNX12 (GAUZE/BANDAGES/DRESSINGS) ×1 IMPLANT
DRAPE EXTREMITY T 121X128X90 (DISPOSABLE) ×2 IMPLANT
ELECT REM PT RETURN 9FT ADLT (ELECTROSURGICAL) ×2
ELECTRODE REM PT RTRN 9FT ADLT (ELECTROSURGICAL) ×1 IMPLANT
GLOVE SURG SS PI 8.0 STRL IVOR (GLOVE) ×2 IMPLANT
GOWN STRL REUS W/ TWL LRG LVL3 (GOWN DISPOSABLE) ×2 IMPLANT
GOWN STRL REUS W/ TWL XL LVL3 (GOWN DISPOSABLE) ×1 IMPLANT
GOWN STRL REUS W/TWL LRG LVL3 (GOWN DISPOSABLE) ×4
GOWN STRL REUS W/TWL XL LVL3 (GOWN DISPOSABLE) ×2
INSERT FOGARTY SM (MISCELLANEOUS) IMPLANT
KIT BASIN OR (CUSTOM PROCEDURE TRAY) ×2 IMPLANT
KIT TURNOVER KIT B (KITS) ×2 IMPLANT
NS IRRIG 1000ML POUR BTL (IV SOLUTION) ×2 IMPLANT
PACK CV ACCESS (CUSTOM PROCEDURE TRAY) ×2 IMPLANT
PAD ARMBOARD 7.5X6 YLW CONV (MISCELLANEOUS) ×4 IMPLANT
PENCIL SMOKE EVACUATOR (MISCELLANEOUS) ×2 IMPLANT
STRIP CLOSURE SKIN 1/2X4 (GAUZE/BANDAGES/DRESSINGS) ×2 IMPLANT
SUT MNCRL AB 4-0 PS2 18 (SUTURE) ×2 IMPLANT
SUT PROLENE 6 0 BV (SUTURE) ×2 IMPLANT
SUT VIC AB 3-0 SH 27 (SUTURE) ×2
SUT VIC AB 3-0 SH 27X BRD (SUTURE) ×1 IMPLANT
TOWEL GREEN STERILE (TOWEL DISPOSABLE) ×2 IMPLANT
UNDERPAD 30X36 HEAVY ABSORB (UNDERPADS AND DIAPERS) ×2 IMPLANT
WATER STERILE IRR 1000ML POUR (IV SOLUTION) ×2 IMPLANT

## 2020-07-12 NOTE — Discharge Instructions (Signed)
° °  [facility] ° °Discharge Instructions ° °AV Fistula or Graft Surgery for Dialysis Access ° °Please refer to the following instructions for your post-procedure care. Your surgeon or physician assistant will discuss any changes with you. ° °Activity ° °You may drive the day following your surgery, if you are comfortable and no longer taking prescription pain medication. Resume full activity as the soreness in your incision resolves. ° °Bathing/Showering ° °You may shower after you go home. Keep your incision dry for 48 hours. Do not soak in a bathtub, hot tub, or swim until the incision heals completely. You may not shower if you have a hemodialysis catheter. ° °Incision Care ° °Clean your incision with mild soap and water after 48 hours. Pat the area dry with a clean towel. You do not need a bandage unless otherwise instructed. Do not apply any ointments or creams to your incision. You may have skin glue on your incision. Do not peel it off. It will come off on its own in about one week. Your arm may swell a bit after surgery. To reduce swelling use pillows to elevate your arm so it is above your heart. Your doctor will tell you if you need to lightly wrap your arm with an ACE bandage. ° °Diet ° °Resume your normal diet. There are not special food restrictions following this procedure. In order to heal from your surgery, it is CRITICAL to get adequate nutrition. Your body requires vitamins, minerals, and protein. Vegetables are the best source of vitamins and minerals. Vegetables also provide the perfect balance of protein. Processed food has little nutritional value, so try to avoid this. ° °Medications ° °Resume taking all of your medications. If your incision is causing pain, you may take over-the counter pain relievers such as acetaminophen (Tylenol). If you were prescribed a stronger pain medication, please be aware these medications can cause nausea and constipation. Prevent  nausea by taking the medication with a snack or meal. Avoid constipation by drinking plenty of fluids and eating foods with high amount of fiber, such as fruits, vegetables, and grains. Do not take Tylenol if you are taking prescription pain medications. ° ° ° ° °Follow up °Your surgeon may want to see you in the office following your access surgery. If so, this will be arranged at the time of your surgery. ° °Please call us immediately for any of the following conditions: ° °Increased pain, redness, drainage (pus) from your incision site °Fever of 101 degrees or higher °Severe or worsening pain at your incision site °Hand pain or numbness. ° °Reduce your risk of vascular disease: ° °Stop smoking. If you would like help, call [facility] at 1-800-QUIT-NOW (1-800-784-8669) or [place] at 336-586-4000 ° °Manage your cholesterol °Maintain a desired weight °Control your diabetes °Keep your blood pressure down ° °Dialysis ° °It will take several weeks to several months for your new dialysis access to be ready for use. Your surgeon will determine when it is OK to use it. Your nephrologist will continue to direct your dialysis. You can continue to use your Permcath until your new access is ready for use. ° °If you have any questions, please call the office at [phone]. ° °

## 2020-07-12 NOTE — Anesthesia Procedure Notes (Signed)
Procedure Name: LMA Insertion Date/Time: 07/12/2020 7:33 AM Performed by: Imagene Riches, CRNA Pre-anesthesia Checklist: Patient identified, Emergency Drugs available, Suction available and Patient being monitored Patient Re-evaluated:Patient Re-evaluated prior to induction Oxygen Delivery Method: Circle System Utilized Preoxygenation: Pre-oxygenation with 100% oxygen Induction Type: IV induction Ventilation: Mask ventilation without difficulty LMA: LMA inserted LMA Size: 5.0 Number of attempts: 1 Airway Equipment and Method: Bite block Placement Confirmation: positive ETCO2 Tube secured with: Tape Dental Injury: Teeth and Oropharynx as per pre-operative assessment

## 2020-07-12 NOTE — Progress Notes (Signed)
Patient states that procedure is scheduled for the left and he is left handed.  He informed nurse that Dr. Carlis Abbott said he would re-evaluate laterality day of surgery.  Dr. Stanford Breed notified of patient's request and states that he will evaluate patient.

## 2020-07-12 NOTE — Transfer of Care (Signed)
Immediate Anesthesia Transfer of Care Note  Patient: Chase Rollins  Procedure(s) Performed: LEFT ARM ARTERIOVENOUS (AV) BRACHIOCEPHALIC FISTULA CREATION (Left Arm Upper)  Patient Location: PACU  Anesthesia Type:General  Level of Consciousness: awake, alert  and oriented  Airway & Oxygen Therapy: Patient Spontanous Breathing and Patient connected to nasal cannula oxygen  Post-op Assessment: Report given to RN and Post -op Vital signs reviewed and stable  Post vital signs: Reviewed and stable  Last Vitals:  Vitals Value Taken Time  BP 116/76 07/12/20 0908  Temp    Pulse 75 07/12/20 0909  Resp 10 07/12/20 0909  SpO2 100 % 07/12/20 0909  Vitals shown include unvalidated device data.  Last Pain:  Vitals:   07/12/20 0634  TempSrc:   PainSc: 0-No pain         Complications: No complications documented.

## 2020-07-12 NOTE — Anesthesia Postprocedure Evaluation (Signed)
Anesthesia Post Note  Patient: Chase Rollins  Procedure(s) Performed: LEFT ARM ARTERIOVENOUS (AV) BRACHIOCEPHALIC FISTULA CREATION (Left Arm Upper)     Patient location during evaluation: PACU Anesthesia Type: General Level of consciousness: awake Pain management: pain level controlled Vital Signs Assessment: post-procedure vital signs reviewed and stable Respiratory status: spontaneous breathing Cardiovascular status: stable Postop Assessment: no apparent nausea or vomiting Anesthetic complications: no   No complications documented.  Last Vitals:  Vitals:   07/12/20 0923 07/12/20 0938  BP: 130/83 134/83  Pulse: 76   Resp: 13   Temp:  (!) 36.3 C  SpO2: 98%     Last Pain:  Vitals:   07/12/20 0938  TempSrc:   PainSc: 0-No pain                 Gigi Onstad

## 2020-07-12 NOTE — Op Note (Signed)
DATE OF SERVICE: 07/12/2020  PATIENT:  Chase Rollins  40 y.o. male  PRE-OPERATIVE DIAGNOSIS:  CKD V approaching ESRD  POST-OPERATIVE DIAGNOSIS:  Same  PROCEDURE:   Left upper extremity brachiocephalic arteriovenous fistula creation  SURGEON:  Surgeon(s) and Role:    * Cherre Robins, MD - Primary  ASSISTANT: Risa Grill, PA-C  An assistant was required to facilitate exposure and expedite the case.  ANESTHESIA:   general  EBL: min  BLOOD ADMINISTERED:none  DRAINS: none   LOCAL MEDICATIONS USED:  NONE  SPECIMEN:  none  COUNTS: confirmed correct .  TOURNIQUET:  None  PATIENT DISPOSITION:  PACU - hemodynamically stable.   Delay start of Pharmacological VTE agent (>24hrs) due to surgical blood loss or risk of bleeding: no  INDICATION FOR PROCEDURE: Chase Rollins is a 40 y.o. male with CKD V nearing ESRD. After careful discussion of risks, benefits, and alternatives the patient was offered arterio-venous fistula creation. The patient understood and wished to proceed.  OPERATIVE FINDINGS: excellent caliber cephalic vein and brachial artery. Diminished, but palpable radial pulse on completion. Strong thrill in fistula on completion.  DESCRIPTION OF PROCEDURE: After identification of the patient in the pre-operative holding area, the patient was transferred to the operating room. The patient was positioned supine on the operating room table. Anesthesia was induced. The left arm was prepped and draped in standard fashion. A surgical pause was performed confirming correct patient, procedure, and operative location.  Using intraoperative ultrasound, the course of the left upper extremity superficial veins was mapped.  The cephalic vein appeared adequate for arteriovenous fistula creation.  The brachial artery was similarly mapped.  The artery appeared adequate for arterial venous creation. We ensured there was no anomalous arterial anatomy such as a high  bifurcation.  A transverse incision was made in the left arm just distal to the antecubital crease.  Incision was carried down through subcutaneous tissue until the cephalic vein was identified and skeletonized.  We continued our exposure through the aponeurosis of the biceps.  The brachial artery was encountered its usual position.  The artery was circumferentially exposed and encircled with Silastic Vesseloops.  Patient was systemically heparinized.  The distal cephalic vein was transected.  The distal stump of the cephalic vein was oversewn with a 2-0 silk suture.  The proximal vein was controlled with a bulldog clamp.  The brachial artery was clamped proximally medially.  An anterior arteriotomy was made with a 11 blade.  The arteriotomy was extended with Potts scissors.  Using a parachute technique the cephalic vein was anastomosed to the brachial arteriotomy in end-to-side fashion with continuous running suture of 6-0 Prolene.  Immediately prior to completion the anastomosis was flushed and de-aired.  The anastomosis was completed.  Hemostasis was assured.  The fistula was interrogated with Doppler. Audible bruit was heard throughout the course of the cephalic vein.  A left radial artery pulse was felt.   Upon completion of the case instrument and sharps counts were confirmed correct. The patient was transferred to the PACU in good condition. I was present for all portions of the procedure.  Yevonne Aline. Stanford Breed, MD [facility] Phone Number: [phone] 07/12/2020 9:03 AM

## 2020-07-12 NOTE — H&P (Signed)
See H&P details below. Patient presents for dialysis access. He is left handed and would prefer right sided access, if possible. I explained risks / benefits / alternatives of using autogenous conduit. We specifically discussed higher risk of infection and failure with synthetic access. He is understanding, and wishes to proceed with left arm brachio-cephalic arteriovenous fistula.  Yevonne Aline. Stanford Breed, MD [facility] Phone Number: [phone] 07/12/2020 6:59 AM        Patient name: Chase Rollins MRN: [id number] DOB: April 25, 1981 Sex: male  REASON FOR CONSULT: Discuss permanent dialysis access  HPI: Chase Rollins is a 40 y.o. male, with history of diabetes, hypertension, HIV, stage 5 CKD that presents for re-evaluation for new dialysis access. He was initially seen on 06/28/2018 for AV fistula access evaluation and had no usable surface vein and ultimately elected to delay placement given likely need for AV graft and he was not on dialysis at the time. Still left-handed. 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 No previous upper extremity access. Still prefers right arm if possible. No chest wall implants.  Past Medical History:  Diagnosis Date  . Anemia   . Anemia associated with chronic renal failure   . Chronic kidney disease    Stage IV,  [facility] follows   . Diabetes ([place])    Type I - per Endocrinologist   . HIV (human immunodeficiency virus infection) ([place])   . HTN (hypertension)   . Pneumonia     Past Surgical History:  Procedure Laterality Date  . WISDOM TOOTH EXTRACTION      Family History  Problem Relation Age of Onset  . Diabetes Mother   . Hypertension Mother   . Diabetes Paternal Grandmother   . Hypertension Father   . Heart Problems Paternal Aunt     SOCIAL HISTORY: Social History   Socioeconomic History  . Marital status: Married    Spouse name: Not on file  . Number of children: Not on file  .  Years of education: Not on file  . Highest education level: Not on file  Occupational History  . Not on file  Tobacco Use  . Smoking status: Never Smoker  . Smokeless tobacco: Never Used  Vaping Use  . Vaping Use: Never used  Substance and Sexual Activity  . Alcohol use: Yes    Comment: socially  . Drug use: No  . Sexual activity: Yes    Partners: Male    Comment: pt declined condoms  Other Topics Concern  . Not on file  Social History Narrative  . Not on file   Social Determinants of Health   Financial Resource Strain: Not on file  Food Insecurity: Not on file  Transportation Needs: Not on file  Physical Activity: Not on file  Stress: Not on file  Social Connections: Not on file  Intimate Partner Violence: Not on file    No Known Allergies  Current Facility-Administered Medications  Medication Dose Route Frequency Provider Last Rate Last Admin  . 0.9 %  sodium chloride infusion   Intravenous Continuous Marty Heck, MD      . ceFAZolin (ANCEF) IVPB 2g/100 mL premix  2 g Intravenous 30 min Pre-Op Marty Heck, MD      . chlorhexidine (HIBICLENS) 4 % liquid 4 application  60 mL Topical Once Marty Heck, MD       And  . Derrill Memo ON 07/13/2020] chlorhexidine (HIBICLENS) 4 % liquid 4 application  60  mL Topical Once Marty Heck, MD        REVIEW OF SYSTEMS:  '[X]'$  denotes positive finding, '[ ]'$  denotes negative finding Cardiac  Comments:  Chest pain or chest pressure:    Shortness of breath upon exertion:    Short of breath when lying flat:    Irregular heart rhythm:        Vascular    Pain in calf, thigh, or hip brought on by ambulation:    Pain in feet at night that wakes you up from your sleep:     Blood clot in your veins:    Leg swelling:         Pulmonary    Oxygen at home:    Productive cough:     Wheezing:         Neurologic    Sudden weakness in arms or legs:     Sudden numbness in arms or legs:     Sudden onset of  difficulty speaking or slurred speech:    Temporary loss of vision in one eye:     Problems with dizziness:         Gastrointestinal    Blood in stool:     Vomited blood:         Genitourinary    Burning when urinating:     Blood in urine:        Psychiatric    Major depression:         Hematologic    Bleeding problems:    Problems with blood clotting too easily:        Skin    Rashes or ulcers:        Constitutional    Fever or chills:      PHYSICAL EXAM: Vitals:   07/11/20 1520 07/12/20 0607  BP:  (!) 148/83  Pulse:  74  Resp:  17  Temp:  98.1 F (36.7 C)  TempSrc:  Oral  SpO2:  99%  Weight: 82.6 kg 82.6 kg  Height: '5\' 11"'$  (1.803 m) '5\' 11"'$  (1.803 m)    GENERAL: The patient is a well-nourished male, in no acute distress. The vital signs are documented above. CARDIAC: There is a regular rate and rhythm.  VASCULAR:  2+ radial pulse palpable bilateral upper extremities 2+ brachial pulse palpable bilateral upper extremities No chest wall implants PULMONARY: No respiratory distress. ABDOMEN: Soft and non-tender.  MUSCULOSKELETAL: There are no major deformities or cyanosis. NEUROLOGIC: No focal weakness or paresthesias are detected.   DATA:   Vein mapping today would suggest a borderline right cephalic vein at the antecubital fossa and also a left cephalic vein above the antecubital fossa  Assessment/Plan:  40 year old male with stage V chronic kidney disease that needs permanent dialysis access. He would prefer access in the right arm and it looks like he had a borderline cephalic vein on the right on vein mapping today, but I looked at this myself with the sonosite [place] in the office and this still appears very small and likely would require AV graft. The left cephalic vein does look to be 3 mm at the antecubital fossa and more usable on my evaluation.  He is amendable to left arm AV fistula even though it is his dominant arm. I discussed other option would be  right arm but likely would be AV graft. He would like to schedule for late February. Risk benefits discussed in detail including bleeding, infection, failure to mature, risk of steal, etc..  Marty Heck, MD [facility] [place] Office: [phone]

## 2020-07-13 ENCOUNTER — Encounter (HOSPITAL_COMMUNITY): Payer: Self-pay | Admitting: Vascular Surgery

## 2020-07-14 ENCOUNTER — Other Ambulatory Visit: Payer: Self-pay | Admitting: Endocrinology

## 2020-07-14 IMAGING — US US EXTREM LOW VENOUS*L*
1 series · 13 of 24 positions shown · non-contrast
Comparison: None.

CLINICAL DATA: Left lower extremity edema.  Evaluate for DVT.



[Series 1: us extrem low venous*left* · 0.06mm/px · 13 of 34 slices shown]
[im 1/34]
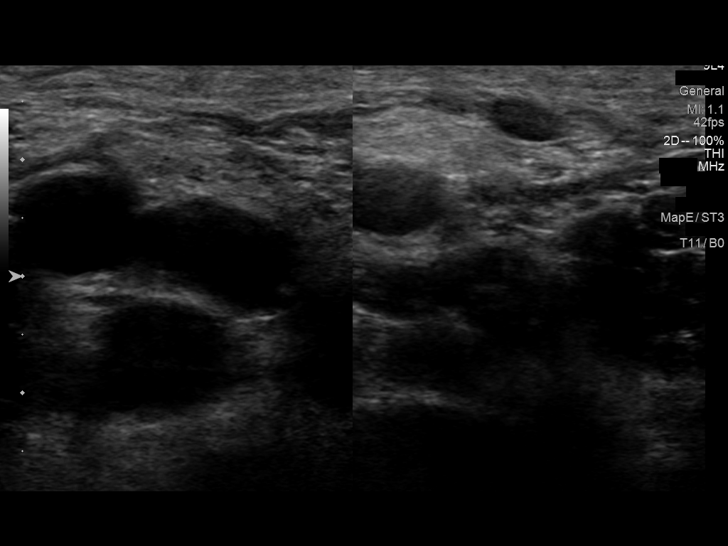
[im 3/34]
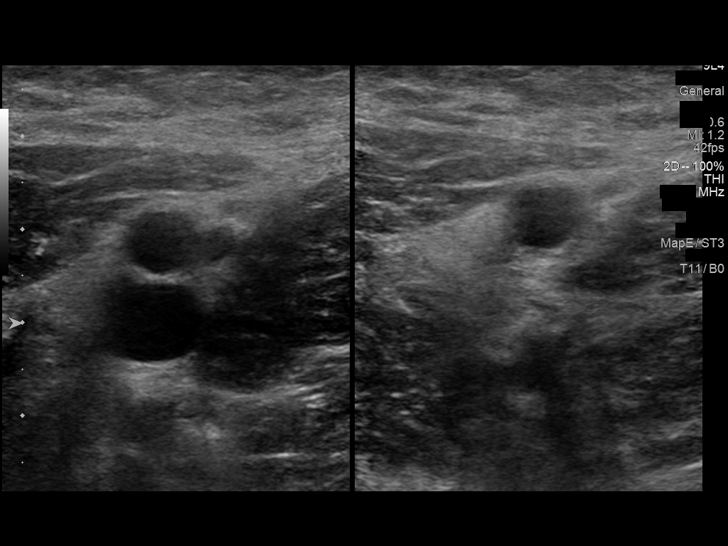
[im 6/34]
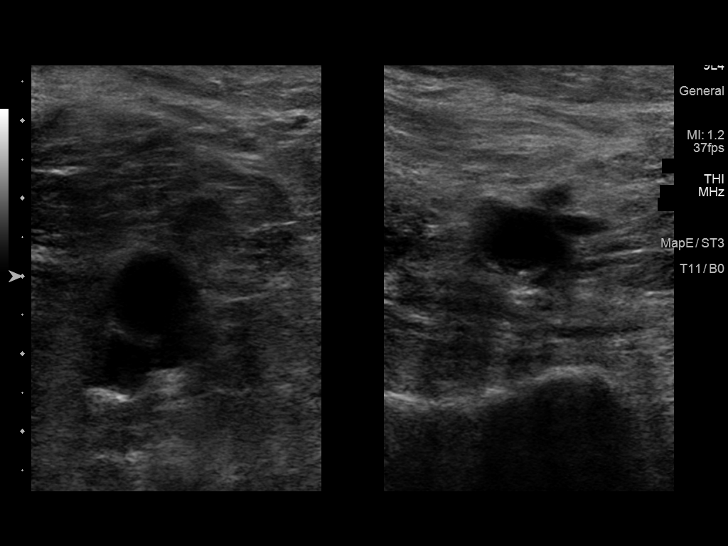
[im 9/34]
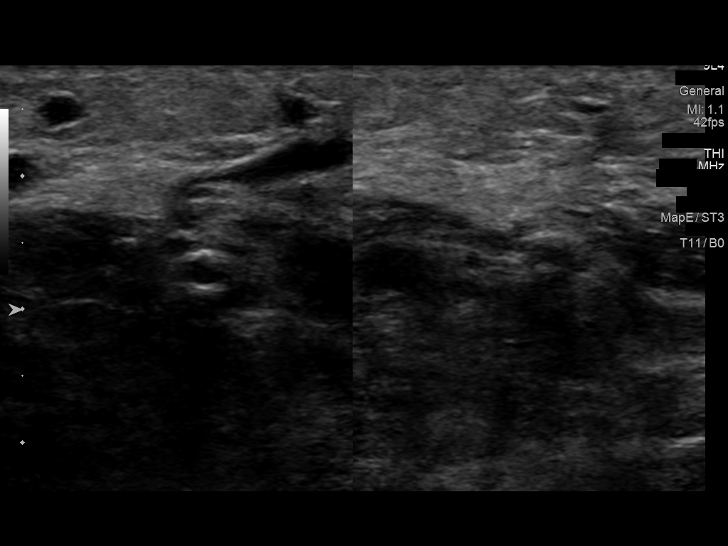
[im 12/34]
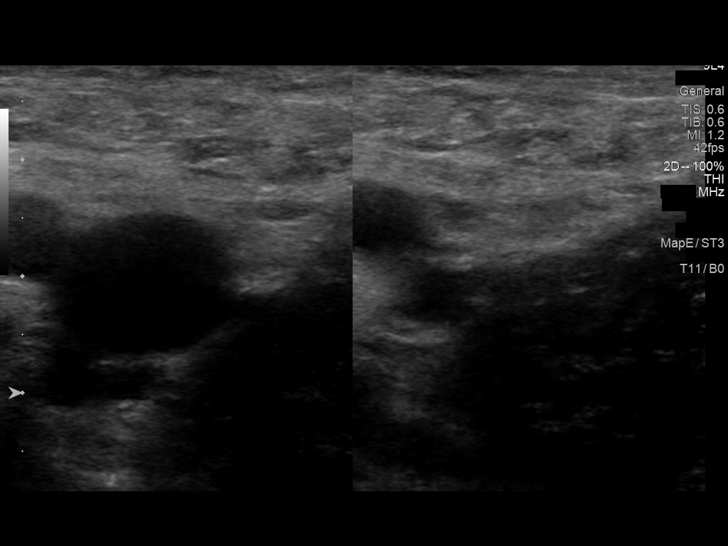
[im 15/34]
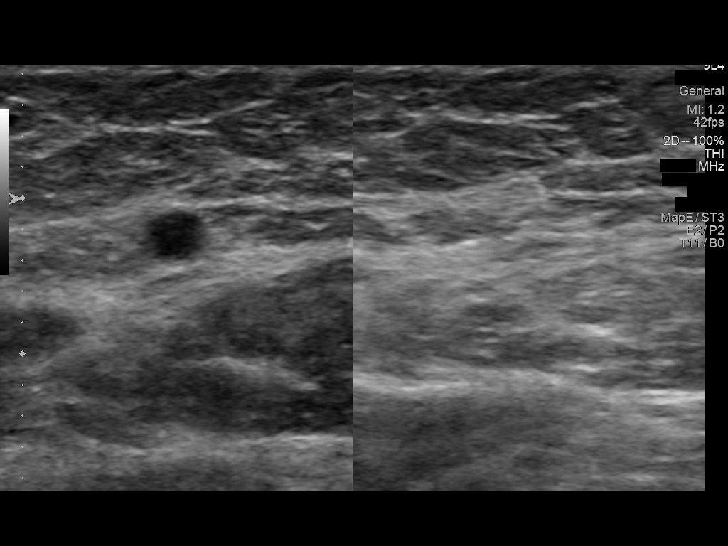
[im 18/34]
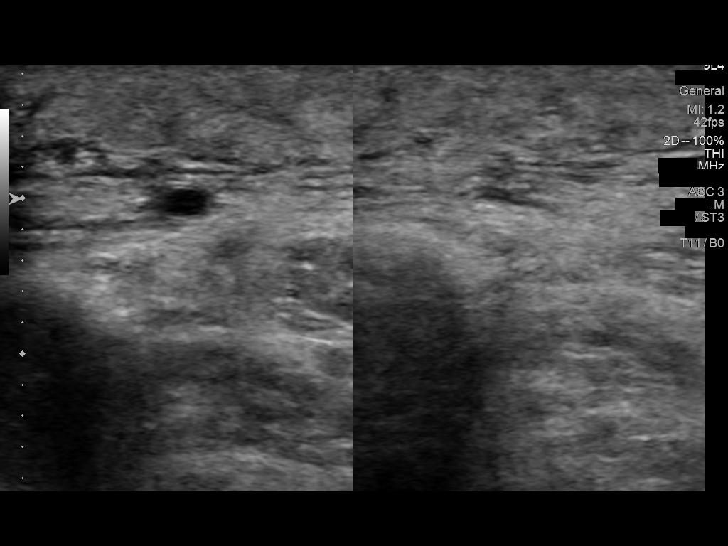
[im 19/34]
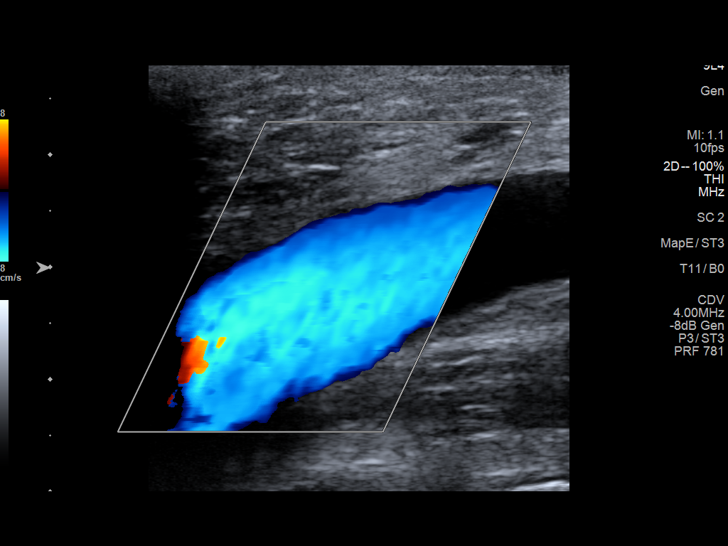
[im 22/34]
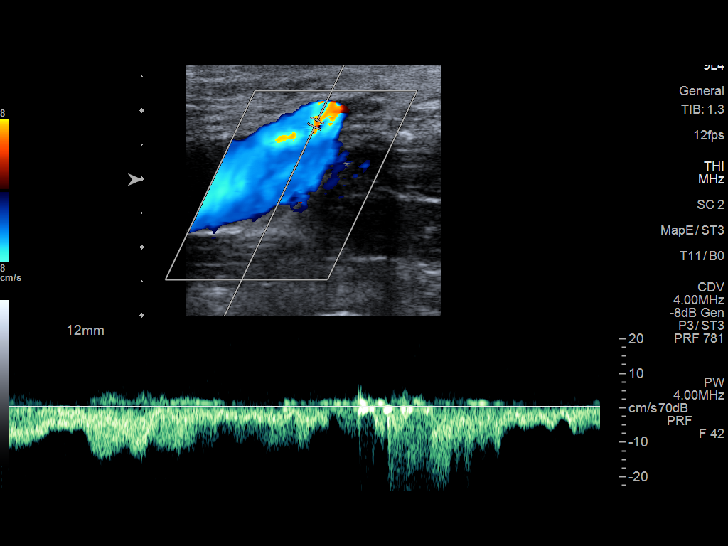
[im 25/34]
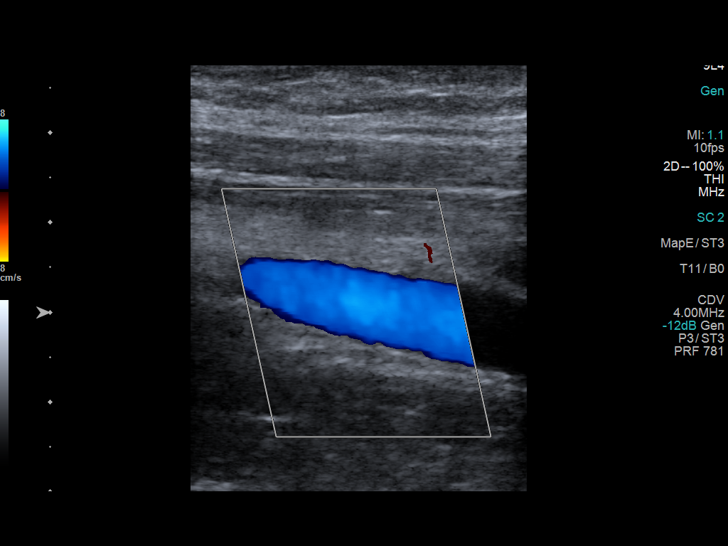
[im 28/34]
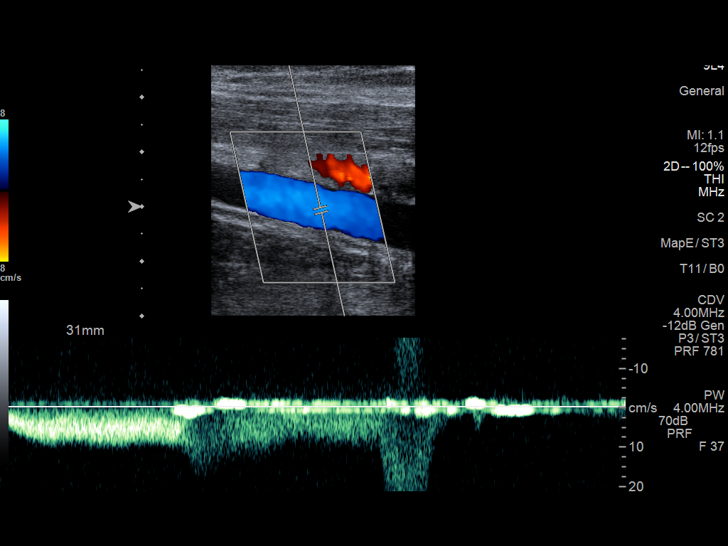
[im 31/34]
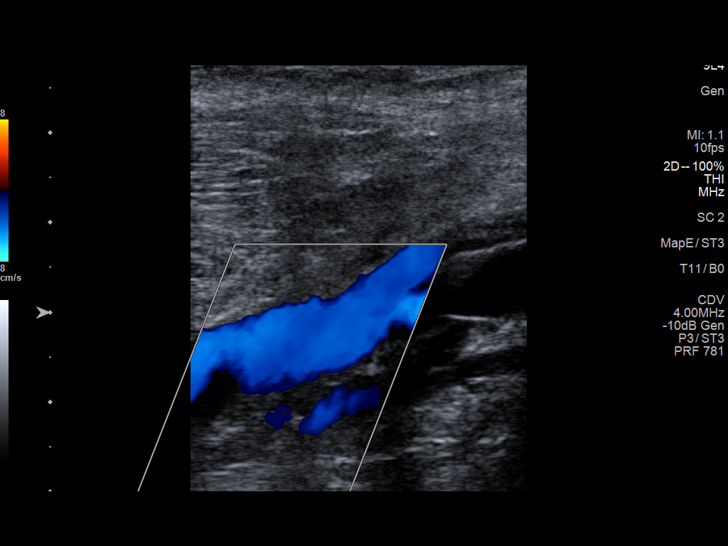
[im 34/34]
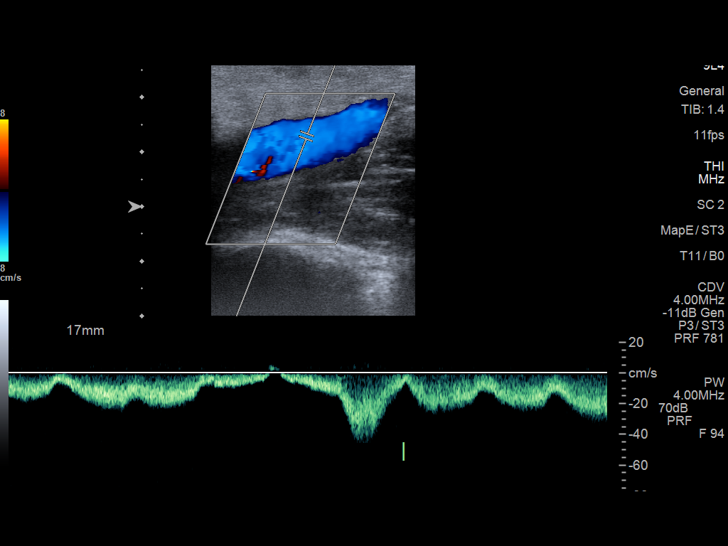

[13 of 24 positions shown; findings below may reference images not displayed]

FINDINGS: Contralateral Common Femoral Vein: Respiratory phasicity is normal
and symmetric with the symptomatic side. No evidence of thrombus.
Normal compressibility.

Common Femoral Vein: No evidence of thrombus. Normal
compressibility, respiratory phasicity and response to augmentation.

Saphenofemoral Junction: No evidence of thrombus. Normal
compressibility and flow on color Doppler imaging.

Profunda Femoral Vein: No evidence of thrombus. Normal
compressibility and flow on color Doppler imaging.

Femoral Vein: No evidence of thrombus. Normal compressibility,
respiratory phasicity and response to augmentation.

Popliteal Vein: No evidence of thrombus. Normal compressibility,
respiratory phasicity and response to augmentation.

Calf Veins: No evidence of thrombus. Normal compressibility and flow
on color Doppler imaging.

Superficial Great Saphenous Vein: No evidence of thrombus. Normal
compressibility.

Venous Reflux:  None.

Other Findings:  None.
IMPRESSION: No evidence of DVT within the left lower extremity.

## 2020-07-16 NOTE — Telephone Encounter (Signed)
Please call patient and have not been seen over 1 year. Will not refill until appointment have been schedule.

## 2020-07-16 NOTE — Telephone Encounter (Signed)
LMTCB to schedule appt

## 2020-07-30 ENCOUNTER — Other Ambulatory Visit: Payer: Self-pay

## 2020-07-30 ENCOUNTER — Ambulatory Visit (INDEPENDENT_AMBULATORY_CARE_PROVIDER_SITE_OTHER): Payer: No Typology Code available for payment source | Admitting: Endocrinology

## 2020-07-30 VITALS — BP 140/80 | HR 81 | Ht 71.0 in | Wt 189.4 lb

## 2020-07-30 DIAGNOSIS — E1021 Type 1 diabetes mellitus with diabetic nephropathy: Secondary | ICD-10-CM

## 2020-07-30 DIAGNOSIS — N1831 Chronic kidney disease, stage 3a: Secondary | ICD-10-CM | POA: Diagnosis not present

## 2020-07-30 LAB — POCT GLYCOSYLATED HEMOGLOBIN (HGB A1C): Hemoglobin A1C: 6.4 % — AB (ref 4.0–5.6)

## 2020-07-30 NOTE — Patient Instructions (Addendum)
check your blood sugar twice a day.  vary the time of day when you check, between before the 3 meals, and at bedtime.  also check if you have symptoms of your blood sugar being too high or too low.  please keep a record of the readings and bring it to your next appointment here (or you can bring the meter itself).  You can write it on any piece of paper.  please call us sooner if your blood sugar goes below 70, or if you have a lot of readings over 200.   Please continue the same insulin.    On this type of insulin schedule, you should eat meals on a regular schedule.  If a meal is missed or significantly delayed, your blood sugar could go low.   Please come back for a follow-up appointment in 4 months.

## 2020-07-30 NOTE — Progress Notes (Signed)
Subjective:    Patient ID: Chase Rollins, male    DOB: 10/18/1980, 40 y.o.   MRN: [id number]  HPI Pt returns for f/u of diabetes mellitus: DM type: 1 Dx'ed: 0000000 Complications: PN, DR, and stage 5 renal failure.   Therapy: insulin since soon after dx.  DKA: never Severe hypoglycemia: never.  Pancreatitis: never.  Pancreatic imaging: never.  Other: he takes qd insulin, after poor results with more frequent injections; fructosamine converts to A1c approx 1% higher than A1c itself.   Interval history: no cbg record, but states cbg's are in the 100's.  He takes insulin as rx'ed.  pt states he feels better in general.  Past Medical History:  Diagnosis Date  . Anemia   . Anemia associated with chronic renal failure   . Chronic kidney disease    Stage IV,   [facility] follows   . Diabetes ([place])    Type I - per Endocrinologist   . HIV (human immunodeficiency virus infection) ([place])   . HTN (hypertension)   . Pneumonia     Past Surgical History:  Procedure Laterality Date  . AV FISTULA PLACEMENT Left 07/12/2020   Procedure: LEFT ARM ARTERIOVENOUS (AV) BRACHIOCEPHALIC FISTULA CREATION;  Surgeon: Cherre Robins, MD;  Location: [facility];  Service: Vascular;  Laterality: Left;  . WISDOM TOOTH EXTRACTION      Social History   Socioeconomic History  . Marital status: Married    Spouse name: Not on file  . Number of children: Not on file  . Years of education: Not on file  . Highest education level: Not on file  Occupational History  . Not on file  Tobacco Use  . Smoking status: Never Smoker  . Smokeless tobacco: Never Used  Vaping Use  . Vaping Use: Never used  Substance and Sexual Activity  . Alcohol use: Yes    Comment: socially  . Drug use: No  . Sexual activity: Yes    Partners: Male    Comment: pt declined condoms  Other Topics Concern  . Not on file  Social History Narrative  . Not on file   Social Determinants of Health   Financial Resource Strain:  Not on file  Food Insecurity: Not on file  Transportation Needs: Not on file  Physical Activity: Not on file  Stress: Not on file  Social Connections: Not on file  Intimate Partner Violence: Not on file    Current Outpatient Medications on File Prior to Visit  Medication Sig Dispense Refill  . acetaminophen (TYLENOL) 500 MG tablet Take 1,000 mg by mouth every 6 (six) hours as needed.    Marland Kitchen amLODipine (NORVASC) 10 MG tablet Take 10 mg by mouth at bedtime.    Marland Kitchen atorvastatin (LIPITOR) 10 MG tablet TAKE 1 TABLET (10 MG TOTAL) BY MOUTH ONCE DAILY AT 6 PM. (Patient taking differently: Take 10 mg by mouth daily.) 30 tablet 0  . bictegravir-emtricitabine-tenofovir AF (BIKTARVY) 50-200-25 MG TABS tablet Take 1 tablet by mouth daily. (Patient taking differently: Take 1 tablet by mouth daily. Takes in the evening) 90 tablet 1  . calcitRIOL (ROCALTROL) 0.25 MCG capsule Take 0.25 mcg by mouth daily.   6  . cetirizine (ZYRTEC) 10 MG tablet Take 10 mg by mouth daily as needed for allergies.    . furosemide (LASIX) 40 MG tablet Take 40 mg by mouth every Monday, Wednesday, and Friday.    . hydrALAZINE (APRESOLINE) 25 MG tablet Take 25 mg by mouth 2 (two) times  daily.    Marland Kitchen HYDROcodone-acetaminophen (NORCO/VICODIN) 5-325 MG tablet Take 1 tablet by mouth every 4 (four) hours as needed for moderate pain. 12 tablet 0  . Insulin Glargine (BASAGLAR KWIKPEN) 100 UNIT/ML SOPN Inject 0.32 mLs (32 Units total) into the skin every morning. 45 mL 2  . metoprolol tartrate (LOPRESSOR) 50 MG tablet Take 50 mg by mouth 2 (two) times daily.    . sodium bicarbonate 650 MG tablet Take 1,950 mg by mouth 2 (two) times daily.     No current facility-administered medications on file prior to visit.    No Known Allergies  Family History  Problem Relation Age of Onset  . Diabetes Mother   . Hypertension Mother   . Diabetes Paternal Grandmother   . Hypertension Father   . Heart Problems Paternal Aunt     BP 140/80 (BP  Location: Right Arm, Patient Position: Sitting, Cuff Size: Normal)   Pulse 81   Ht '5\' 11"'$  (1.803 m)   Wt 189 lb 6.4 oz (85.9 kg)   SpO2 98%   BMI 26.42 kg/m    Review of Systems He denies hypoglycemia    Objective:   Physical Exam VITAL SIGNS:  See vs page GENERAL: no distress Pulses: dorsalis pedis intact bilat.   MSK: no deformity of the feet CV: 1+ left, and trace right leg edema.  Skin:  no ulcer on the feet.  normal color and temp on the feet.   Neuro: sensation is intact to touch on the feet.    Lab Results  Component Value Date   CREATININE 6.00 (H) 07/12/2020   BUN 35 (H) 07/12/2020   NA 140 07/12/2020   K 4.3 07/12/2020   CL 105 07/12/2020   CO2 24 12/18/2019    A1c=6.2%     Assessment & Plan:  Type 1 DM, with DR: well-controlled stage 5 renal failure: A1c underestimates avg glucose  Patient Instructions  check your blood sugar twice a day.  vary the time of day when you check, between before the 3 meals, and at bedtime.  also check if you have symptoms of your blood sugar being too high or too low.  please keep a record of the readings and bring it to your next appointment here (or you can bring the meter itself).  You can write it on any piece of paper.  please call us sooner if your blood sugar goes below 70, or if you have a lot of readings over 200.   Please continue the same insulin.    On this type of insulin schedule, you should eat meals on a regular schedule.  If a meal is missed or significantly delayed, your blood sugar could go low.   Please come back for a follow-up appointment in 4 months.

## 2020-08-15 ENCOUNTER — Other Ambulatory Visit: Payer: Self-pay

## 2020-08-15 DIAGNOSIS — N185 Chronic kidney disease, stage 5: Secondary | ICD-10-CM

## 2020-08-22 NOTE — Telephone Encounter (Signed)
Patient called to ask that Basaglar be refilled - see previous notes. Patient was seen 07/30/20 and has a follow up scheduled for 12/03/20

## 2020-08-22 NOTE — Telephone Encounter (Signed)
I believe this intended for you

## 2020-08-23 ENCOUNTER — Other Ambulatory Visit: Payer: Self-pay | Admitting: Endocrinology

## 2020-08-27 ENCOUNTER — Ambulatory Visit (HOSPITAL_COMMUNITY): Payer: No Typology Code available for payment source

## 2020-09-06 ENCOUNTER — Ambulatory Visit (HOSPITAL_COMMUNITY): Payer: No Typology Code available for payment source

## 2020-09-16 ENCOUNTER — Other Ambulatory Visit: Payer: Self-pay | Admitting: Endocrinology

## 2020-09-17 ENCOUNTER — Ambulatory Visit (INDEPENDENT_AMBULATORY_CARE_PROVIDER_SITE_OTHER): Payer: No Typology Code available for payment source | Admitting: Physician Assistant

## 2020-09-17 ENCOUNTER — Other Ambulatory Visit: Payer: Self-pay

## 2020-09-17 ENCOUNTER — Ambulatory Visit (HOSPITAL_COMMUNITY)
Admission: RE | Admit: 2020-09-17 | Discharge: 2020-09-17 | Disposition: A | Discharge status: Home / Self Care | Payer: No Typology Code available for payment source | Source: Ambulatory Visit | Attending: Vascular Surgery | Admitting: Vascular Surgery

## 2020-09-17 VITALS — BP 140/84 | HR 70 | Temp 98.1°F | Resp 20 | Ht 71.0 in | Wt 180.9 lb

## 2020-09-17 DIAGNOSIS — N185 Chronic kidney disease, stage 5: Secondary | ICD-10-CM

## 2020-09-17 NOTE — Progress Notes (Signed)
    Postoperative Access Visit   History of Present Illness   Chase Rollins is a 40 y.o. year old male who presents for postoperative follow-up for: left brachiocephalic arteriovenous fistula by Dr. Stanford Breed (Date: 07/12/20).  The patient's wounds are healed.  The patient denies steal symptoms.  The patient is able to complete their activities of daily living.  CKD managed by Dr. Royce Macadamia.  He is not yet on HD.  Physical Examination   Vitals:   09/17/20 1120  BP: 140/84  Pulse: 70  Resp: 20  Temp: 98.1 F (36.7 C)  TempSrc: Temporal  SpO2: 99%  Weight: 180 lb 14.4 oz (82.1 kg)  Height: '5\' 11"'$  (1.803 m)   Body mass index is 25.23 kg/m.  left arm Incision is healed, palpable radial pulse, hand grip is 5/5, sensation in digits is intact, palpable thrill, bruit can be auscultated     Medical Decision Making   Chase Rollins is a 40 y.o. year old male who presents s/p left brachiocephalic arteriovenous fistula   Patent fistula without signs or symptoms of steal syndrome  The patient's access is ready for use when HD is initiated  The patient may follow up on a prn basis   Dagoberto Ligas PA-C [facility] [place] Office: [facility] MD: Stanford Breed

## 2020-09-18 ENCOUNTER — Other Ambulatory Visit: Payer: Self-pay | Admitting: Endocrinology

## 2020-09-23 ENCOUNTER — Other Ambulatory Visit: Payer: Self-pay | Admitting: Endocrinology

## 2020-09-23 DIAGNOSIS — N1831 Chronic kidney disease, stage 3a: Secondary | ICD-10-CM

## 2020-09-23 DIAGNOSIS — E1021 Type 1 diabetes mellitus with diabetic nephropathy: Secondary | ICD-10-CM

## 2020-09-23 MED ORDER — BASAGLAR KWIKPEN 100 UNIT/ML ~~LOC~~ SOPN: PEN_INJECTOR | SUBCUTANEOUS | 3 refills | Status: DC

## 2020-09-23 NOTE — Telephone Encounter (Signed)
[id number] [place], [place] - [address] AT [address]:  [phone] Fax:  [phone]    Pt called after calling his pharmacy to check the refills, they told him they do not have any refills in the system for him. After checking to see if there was a confirmation from pharmacy I noticed it states "E-Prescribing Status: Transmission to pharmacy failed (08/22/2020 4:55 PM EDT)".  Pt requests that we please resend his Basaglar to the above pharmacy.

## 2020-09-23 NOTE — Telephone Encounter (Signed)
Rx sent to preferred pharmacy.

## 2020-09-30 ENCOUNTER — Encounter (INDEPENDENT_AMBULATORY_CARE_PROVIDER_SITE_OTHER): Payer: No Typology Code available for payment source | Admitting: Ophthalmology

## 2020-10-07 ENCOUNTER — Encounter (INDEPENDENT_AMBULATORY_CARE_PROVIDER_SITE_OTHER): Payer: No Typology Code available for payment source | Admitting: Ophthalmology

## 2020-10-15 ENCOUNTER — Other Ambulatory Visit: Payer: Self-pay | Admitting: Internal Medicine

## 2020-10-15 DIAGNOSIS — B20 Human immunodeficiency virus [HIV] disease: Secondary | ICD-10-CM

## 2020-10-24 ENCOUNTER — Encounter (INDEPENDENT_AMBULATORY_CARE_PROVIDER_SITE_OTHER): Payer: No Typology Code available for payment source | Admitting: Ophthalmology

## 2020-11-05 ENCOUNTER — Encounter (INDEPENDENT_AMBULATORY_CARE_PROVIDER_SITE_OTHER): Payer: No Typology Code available for payment source | Admitting: Ophthalmology

## 2020-12-03 ENCOUNTER — Ambulatory Visit: Payer: No Typology Code available for payment source | Admitting: Endocrinology

## 2020-12-11 DIAGNOSIS — J189 Pneumonia, unspecified organism: Secondary | ICD-10-CM | POA: Insufficient documentation

## 2020-12-27 ENCOUNTER — Ambulatory Visit: Payer: No Typology Code available for payment source | Admitting: Cardiology

## 2021-02-10 ENCOUNTER — Ambulatory Visit: Payer: BC Managed Care – PPO

## 2021-02-10 ENCOUNTER — Other Ambulatory Visit: Payer: Self-pay

## 2021-02-10 ENCOUNTER — Other Ambulatory Visit (HOSPITAL_COMMUNITY)
Admission: RE | Admit: 2021-02-10 | Discharge: 2021-02-10 | Disposition: A | Discharge status: Home / Self Care | Payer: BC Managed Care – PPO | Source: Ambulatory Visit | Attending: Internal Medicine | Admitting: Internal Medicine

## 2021-02-10 ENCOUNTER — Other Ambulatory Visit: Payer: BC Managed Care – PPO

## 2021-02-10 DIAGNOSIS — Z113 Encounter for screening for infections with a predominantly sexual mode of transmission: Secondary | ICD-10-CM

## 2021-02-10 DIAGNOSIS — B20 Human immunodeficiency virus [HIV] disease: Secondary | ICD-10-CM | POA: Insufficient documentation

## 2021-02-11 LAB — T-HELPER CELL (CD4) - (RCID CLINIC ONLY)
CD4 % Helper T Cell: 53 % (ref 33–65)
CD4 T Cell Abs: 756 /uL (ref 400–1790)

## 2021-02-11 LAB — URINE CYTOLOGY ANCILLARY ONLY
Chlamydia: NEGATIVE
Comment: NEGATIVE
Comment: NORMAL
Neisseria Gonorrhea: NEGATIVE

## 2021-02-12 LAB — CBC WITH DIFFERENTIAL/PLATELET
Absolute Monocytes: 318 cells/uL (ref 200–950)
Basophils Absolute: 30 cells/uL (ref 0–200)
Basophils Relative: 0.8 %
Eosinophils Absolute: 241 cells/uL (ref 15–500)
Eosinophils Relative: 6.5 %
HCT: 36.9 % — ABNORMAL LOW (ref 38.5–50.0)
Hemoglobin: 12 g/dL — ABNORMAL LOW (ref 13.2–17.1)
Lymphs Abs: 1539 cells/uL (ref 850–3900)
MCH: 32.4 pg (ref 27.0–33.0)
MCHC: 32.5 g/dL (ref 32.0–36.0)
MCV: 99.7 fL (ref 80.0–100.0)
MPV: 9.6 fL (ref 7.5–12.5)
Monocytes Relative: 8.6 %
Neutro Abs: 1573 cells/uL (ref 1500–7800)
Neutrophils Relative %: 42.5 %
Platelets: 246 10*3/uL (ref 140–400)
RBC: 3.7 10*6/uL — ABNORMAL LOW (ref 4.20–5.80)
RDW: 13.2 % (ref 11.0–15.0)
Total Lymphocyte: 41.6 %
WBC: 3.7 10*3/uL — ABNORMAL LOW (ref 3.8–10.8)

## 2021-02-12 LAB — COMPLETE METABOLIC PANEL WITH GFR
AG Ratio: 1.5 (calc) (ref 1.0–2.5)
ALT: 8 U/L — ABNORMAL LOW (ref 9–46)
AST: 7 U/L — ABNORMAL LOW (ref 10–40)
Albumin: 4.1 g/dL (ref 3.6–5.1)
Alkaline phosphatase (APISO): 54 U/L (ref 36–130)
BUN/Creatinine Ratio: 7 (calc) (ref 6–22)
BUN: 43 mg/dL — ABNORMAL HIGH (ref 7–25)
CO2: 27 mmol/L (ref 20–32)
Calcium: 9 mg/dL (ref 8.6–10.3)
Chloride: 105 mmol/L (ref 98–110)
Creat: 6.4 mg/dL — ABNORMAL HIGH (ref 0.60–1.26)
Globulin: 2.8 g/dL (calc) (ref 1.9–3.7)
Glucose, Bld: 168 mg/dL — ABNORMAL HIGH (ref 65–99)
Potassium: 4.6 mmol/L (ref 3.5–5.3)
Sodium: 138 mmol/L (ref 135–146)
Total Bilirubin: 0.5 mg/dL (ref 0.2–1.2)
Total Protein: 6.9 g/dL (ref 6.1–8.1)
eGFR: 11 mL/min/{1.73_m2} — ABNORMAL LOW (ref >=60)

## 2021-02-12 LAB — HIV-1 RNA QUANT-NO REFLEX-BLD
HIV 1 RNA Quant: NOT DETECTED Copies/mL
HIV-1 RNA Quant, Log: NOT DETECTED Log cps/mL

## 2021-02-12 LAB — RPR: RPR Ser Ql: NONREACTIVE

## 2021-02-13 ENCOUNTER — Ambulatory Visit: Payer: Self-pay | Admitting: Cardiology

## 2021-02-15 HISTORY — PX: COMBINED KIDNEY-PANCREAS TRANSPLANT: SHX1382

## 2021-02-24 ENCOUNTER — Ambulatory Visit (INDEPENDENT_AMBULATORY_CARE_PROVIDER_SITE_OTHER): Payer: BC Managed Care – PPO | Admitting: Internal Medicine

## 2021-02-24 ENCOUNTER — Encounter: Payer: Self-pay | Admitting: Internal Medicine

## 2021-02-24 ENCOUNTER — Other Ambulatory Visit: Payer: Self-pay

## 2021-02-24 VITALS — BP 138/81 | HR 80 | Temp 98.2°F | Ht 72.0 in | Wt 180.0 lb

## 2021-02-24 DIAGNOSIS — B20 Human immunodeficiency virus [HIV] disease: Secondary | ICD-10-CM

## 2021-02-24 DIAGNOSIS — Z113 Encounter for screening for infections with a predominantly sexual mode of transmission: Secondary | ICD-10-CM

## 2021-02-24 DIAGNOSIS — N185 Chronic kidney disease, stage 5: Secondary | ICD-10-CM | POA: Diagnosis not present

## 2021-02-24 MED ORDER — BIKTARVY 50-200-25 MG PO TABS: ORAL_TABLET | ORAL | 3 refills | Status: DC

## 2021-02-25 ENCOUNTER — Encounter: Payer: Self-pay | Admitting: Internal Medicine

## 2021-02-25 NOTE — Progress Notes (Signed)
   Subjective:    Patient ID: Chase Rollins, male    DOB: 03/29/1981, 40 y.o.   MRN: [id number]  HPI Here for follow up of HIV He continues on Biktarvy and no missed doses. CD4 756, viral load not detected.  His creat has continued to slowly climb over time but relatively stable.  Has undergone evaluation for transplant.  No issues with getting, taking or tolerating Biktarvy.    Review of Systems  Constitutional:  Negative for fatigue.  Gastrointestinal:  Negative for diarrhea and nausea.  Skin:  Negative for rash.      Objective:   Physical Exam Eyes:     General: No scleral icterus. Cardiovascular:     Rate and Rhythm: Normal rate.  Pulmonary:     Effort: Pulmonary effort is normal.  Skin:    Findings: No rash.  Neurological:     General: No focal deficit present.     Mental Status: He is alert.  Psychiatric:        Mood and Affect: Mood normal.    SH: no tobacco      Assessment & Plan:

## 2021-02-25 NOTE — Assessment & Plan Note (Signed)
Not requiring dialysis at this time.  Fistula placed.  Monitored closely by nephrology

## 2021-02-25 NOTE — Assessment & Plan Note (Signed)
He continues to do well on his regimen and no new concerns.  He will continue with this.   OK from my standpoint to switch to a tenofovir-sparing regimen if desired by the transplant team, such as Dovato.

## 2021-02-25 NOTE — Assessment & Plan Note (Signed)
Screened negative 

## 2021-03-04 DIAGNOSIS — Z792 Long term (current) use of antibiotics: Secondary | ICD-10-CM | POA: Insufficient documentation

## 2021-03-04 DIAGNOSIS — Z940 Kidney transplant status: Secondary | ICD-10-CM | POA: Insufficient documentation

## 2021-03-04 DIAGNOSIS — D849 Immunodeficiency, unspecified: Secondary | ICD-10-CM | POA: Insufficient documentation

## 2021-03-04 DIAGNOSIS — N185 Chronic kidney disease, stage 5: Secondary | ICD-10-CM | POA: Insufficient documentation

## 2021-03-04 HISTORY — DX: Kidney transplant status: Z94.0

## 2021-03-04 HISTORY — PX: COMBINED KIDNEY-PANCREAS TRANSPLANT: SHX1382

## 2021-03-07 HISTORY — PX: ABDOMINAL EXPLORATION SURGERY: SHX538

## 2021-03-12 NOTE — Progress Notes (Incomplete)
Cardiology Office Note:    Date:  03/12/2021   ID:  Chase Rollins, DOB Sep 22, 1980, MRN [id number]  PCP:  Patient, No Pcp Per (Inactive)  Cardiologist:  Shirlee More, MD    Referring MD: No ref. provider found    ASSESSMENT:    No diagnosis found. PLAN:    In order of problems listed above:  ***   Next appointment: ***   Medication Adjustments/Labs and Tests Ordered: Current medicines are reviewed at length with the patient today.  Concerns regarding medicines are outlined above.  No orders of the defined types were placed in this encounter.  No orders of the defined types were placed in this encounter.   No chief complaint on file.   History of Present Illness:    Chase Rollins is a 40 y.o. male with a hx of hypertensive heart disease with CKD and diabetes last seen by me 10/27/2018.  He was admitted to [facility]/03/2021 for combined renal and pancreas transplantation Compliance with diet, lifestyle and medications: *** Past Medical History:  Diagnosis Date   Anemia    Anemia associated with chronic renal failure    Chronic kidney disease    Stage IV,  [facility] follows    Diabetes ([place])    Type I - per Endocrinologist    HIV (human immunodeficiency virus infection) ([place])    HTN (hypertension)    Pneumonia     Past Surgical History:  Procedure Laterality Date   AV FISTULA PLACEMENT Left 07/12/2020   Procedure: LEFT ARM ARTERIOVENOUS (AV) BRACHIOCEPHALIC FISTULA CREATION;  Surgeon: Cherre Robins, MD;  Location: [facility];  Service: Vascular;  Laterality: Left;   WISDOM TOOTH EXTRACTION      Current Medications: No outpatient medications have been marked as taking for the 03/13/21 encounter (Appointment) with Richardo Priest, MD.     Allergies:   Patient has no known allergies.   Social History   Socioeconomic History   Marital status: Married    Spouse name: Not on file   Number of children: Not on file   Years of education: Not  on file   Highest education level: Not on file  Occupational History   Not on file  Tobacco Use   Smoking status: Never   Smokeless tobacco: Never  Vaping Use   Vaping Use: Never used  Substance and Sexual Activity   Alcohol use: Yes    Comment: socially   Drug use: No   Sexual activity: Yes    Partners: Male    Comment: accepted condoms 02/2021  Other Topics Concern   Not on file  Social History Narrative   Not on file   Social Determinants of Health   Financial Resource Strain: Not on file  Food Insecurity: Not on file  Transportation Needs: Not on file  Physical Activity: Not on file  Stress: Not on file  Social Connections: Not on file     Family History: The patient's ***family history includes Diabetes in his mother and paternal grandmother; Heart Problems in his paternal aunt; Hypertension in his father and mother. ROS:   Please see the history of present illness.    All other systems reviewed and are negative.  EKGs/Labs/Other Studies Reviewed:    The following studies were reviewed today:  EKG:  EKG ordered today and personally reviewed.  The ekg ordered today demonstrates ***  Recent Labs: 02/10/2021: ALT 8; BUN 43; Creat 6.40; Hemoglobin 12.0; Platelets 246; Potassium 4.6; Sodium 138  Recent Lipid Panel    Component Value Date/Time   CHOL 197 12/18/2019 1155   CHOL 135 09/27/2017 1002   TRIG 99 12/18/2019 1155   HDL 50 12/18/2019 1155   HDL 40 09/27/2017 1002   CHOLHDL 3.9 12/18/2019 1155   VLDL 24 04/02/2016 1434   LDLCALC 126 (H) 12/18/2019 1155    Physical Exam:    VS:  There were no vitals taken for this visit.    Wt Readings from Last 3 Encounters:  02/24/21 180 lb (81.6 kg)  09/17/20 180 lb 14.4 oz (82.1 kg)  07/30/20 189 lb 6.4 oz (85.9 kg)     GEN: *** Well nourished, well developed in no acute distress HEENT: Normal NECK: No JVD; No carotid bruits LYMPHATICS: No lymphadenopathy CARDIAC: ***RRR, no murmurs, rubs,  gallops RESPIRATORY:  Clear to auscultation without rales, wheezing or rhonchi  ABDOMEN: Soft, non-tender, non-distended MUSCULOSKELETAL:  No edema; No deformity  SKIN: Warm and dry NEUROLOGIC:  Alert and oriented x 3 PSYCHIATRIC:  Normal affect    Signed, Shirlee More, MD  03/12/2021 6:45 PM    [facility]

## 2021-03-13 ENCOUNTER — Ambulatory Visit: Payer: Self-pay | Admitting: Cardiology

## 2021-04-09 ENCOUNTER — Other Ambulatory Visit: Payer: Self-pay

## 2021-04-09 ENCOUNTER — Other Ambulatory Visit: Payer: Self-pay | Admitting: Internal Medicine

## 2021-04-09 DIAGNOSIS — B20 Human immunodeficiency virus [HIV] disease: Secondary | ICD-10-CM

## 2021-04-09 MED ORDER — BIKTARVY 50-200-25 MG PO TABS: ORAL_TABLET | ORAL | 3 refills | Status: DC

## 2021-04-09 NOTE — Telephone Encounter (Signed)
[facility] and [facility]   Beryle Flock, RN

## 2022-02-03 ENCOUNTER — Ambulatory Visit: Payer: BC Managed Care – PPO | Admitting: Family Medicine

## 2022-02-17 ENCOUNTER — Other Ambulatory Visit (HOSPITAL_COMMUNITY)
Admission: RE | Admit: 2022-02-17 | Discharge: 2022-02-17 | Disposition: A | Discharge status: Home / Self Care | Payer: BC Managed Care – PPO | Source: Ambulatory Visit | Attending: Internal Medicine | Admitting: Internal Medicine

## 2022-02-17 ENCOUNTER — Other Ambulatory Visit: Payer: Self-pay

## 2022-02-17 ENCOUNTER — Other Ambulatory Visit: Payer: BC Managed Care – PPO

## 2022-02-17 DIAGNOSIS — Z113 Encounter for screening for infections with a predominantly sexual mode of transmission: Secondary | ICD-10-CM

## 2022-02-17 DIAGNOSIS — B20 Human immunodeficiency virus [HIV] disease: Secondary | ICD-10-CM

## 2022-02-18 ENCOUNTER — Ambulatory Visit: Payer: BC Managed Care – PPO | Admitting: Family Medicine

## 2022-02-18 LAB — URINE CYTOLOGY ANCILLARY ONLY
Chlamydia: NEGATIVE
Comment: NEGATIVE
Comment: NORMAL
Neisseria Gonorrhea: NEGATIVE

## 2022-02-18 LAB — T-HELPER CELL (CD4) - (RCID CLINIC ONLY)
CD4 % Helper T Cell: 42 % (ref 33–65)
CD4 T Cell Abs: 317 /uL — ABNORMAL LOW (ref 400–1790)

## 2022-02-19 ENCOUNTER — Other Ambulatory Visit (HOSPITAL_COMMUNITY): Payer: Self-pay

## 2022-02-19 LAB — RPR: RPR Ser Ql: NONREACTIVE

## 2022-02-19 LAB — HIV-1 RNA QUANT-NO REFLEX-BLD
HIV 1 RNA Quant: NOT DETECTED Copies/mL
HIV-1 RNA Quant, Log: NOT DETECTED Log cps/mL

## 2022-02-23 ENCOUNTER — Encounter: Payer: Self-pay | Admitting: Family Medicine

## 2022-02-23 ENCOUNTER — Other Ambulatory Visit (INDEPENDENT_AMBULATORY_CARE_PROVIDER_SITE_OTHER): Payer: BC Managed Care – PPO

## 2022-02-23 ENCOUNTER — Ambulatory Visit (INDEPENDENT_AMBULATORY_CARE_PROVIDER_SITE_OTHER): Payer: Self-pay | Admitting: Family Medicine

## 2022-02-23 VITALS — BP 124/76 | HR 76 | Temp 98.1°F | Ht 72.0 in | Wt 180.6 lb

## 2022-02-23 DIAGNOSIS — N185 Chronic kidney disease, stage 5: Secondary | ICD-10-CM | POA: Diagnosis not present

## 2022-02-23 DIAGNOSIS — E103492 Type 1 diabetes mellitus with severe nonproliferative diabetic retinopathy without macular edema, left eye: Secondary | ICD-10-CM

## 2022-02-23 DIAGNOSIS — E1022 Type 1 diabetes mellitus with diabetic chronic kidney disease: Secondary | ICD-10-CM | POA: Diagnosis not present

## 2022-02-23 DIAGNOSIS — Z9483 Pancreas transplant status: Secondary | ICD-10-CM

## 2022-02-23 DIAGNOSIS — Z940 Kidney transplant status: Secondary | ICD-10-CM

## 2022-02-23 DIAGNOSIS — E103491 Type 1 diabetes mellitus with severe nonproliferative diabetic retinopathy without macular edema, right eye: Secondary | ICD-10-CM

## 2022-02-23 DIAGNOSIS — B20 Human immunodeficiency virus [HIV] disease: Secondary | ICD-10-CM

## 2022-02-23 DIAGNOSIS — R0789 Other chest pain: Secondary | ICD-10-CM

## 2022-02-23 DIAGNOSIS — E785 Hyperlipidemia, unspecified: Secondary | ICD-10-CM | POA: Diagnosis not present

## 2022-02-23 DIAGNOSIS — I10 Essential (primary) hypertension: Secondary | ICD-10-CM | POA: Diagnosis not present

## 2022-02-23 DIAGNOSIS — Z23 Encounter for immunization: Secondary | ICD-10-CM

## 2022-02-23 DIAGNOSIS — L84 Corns and callosities: Secondary | ICD-10-CM

## 2022-02-23 DIAGNOSIS — B351 Tinea unguium: Secondary | ICD-10-CM

## 2022-02-23 LAB — LIPID PANEL
Cholesterol: 182 mg/dL (ref 0–200)
HDL: 57.3 mg/dL (ref >39.00)
LDL Cholesterol: 100 mg/dL — ABNORMAL HIGH (ref 0–99)
NonHDL: 124.7
Total CHOL/HDL Ratio: 3
Triglycerides: 126 mg/dL (ref 0.0–149.0)
VLDL: 25.2 mg/dL (ref 0.0–40.0)

## 2022-02-23 LAB — MICROALBUMIN / CREATININE URINE RATIO
Creatinine,U: 163.7 mg/dL
Microalb Creat Ratio: 4.7 mg/g (ref 0.0–30.0)
Microalb, Ur: 7.7 mg/dL — ABNORMAL HIGH (ref 0.0–1.9)

## 2022-02-23 LAB — HEMOGLOBIN A1C: Hgb A1c MFr Bld: 6.3 % (ref 4.6–6.5)

## 2022-02-23 LAB — GLUCOSE, RANDOM: Glucose, Bld: 89 mg/dL (ref 70–99)

## 2022-02-23 NOTE — Progress Notes (Signed)
[facility] [address] [place] [address] Dept: [phone] Dept Fax: [phone]  New Patient Office Visit  Subjective:    Patient ID: Willa Rough III, male    DOB: 11-May-1981, 41 y.o..   MRN: [id number]  Chief Complaint  Patient presents with   Establish Care    NP-establish care.  Fasting today.  Wants flu shot.   C/o having weird chest pressure off/on x 1-2 months.     History of Present Illness:  Patient is in today to establish care. Mr. Milke was born in [place], [place]. He attended [employer], receiving a degree in human services counseling. 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 He has been married to his husband, Corene Cornea, for 2 years. They have no children. He denies use of tobacco or drugs. He rarely drinks alcohol.  Mr. Flammer has a history of Type 1 diabetes complicated with nonproliferative retinopathy and chronic kidney disease. He underwent a pancreas and kidney transplant about a year ago. This has normalized his renal function and resolved his diabetes. He is no longer using any insulin. Related to his transplant is managed on mycophenolate mofetil 250 mg, 2 capsules bid, prednisone 5 mg daily, and tacrolimus 1 mg bid. He is currently on Bactrim three days a week, but will be completing his year's coverage with this soon.  Mr. Aceituno has a history of HIV infection. He is managed on  Biktarvy 50-200-25 mg daily. He notes his HIV has been undetectable.  Mr. Coury has a history of hypertension. He is managed on amlodipine 10 mg daily.  Mr. Warf notes that he has been experiencing some chest discomfort for some weeks now. He notes this is more right sided. He has trouble describing how this feels. He does not have radiation of the discomfort.  Past Medical History: Patient Active Problem List   Diagnosis Date Noted   Type 1 diabetes mellitus with  stage 5 chronic kidney disease not on chronic dialysis ([place]) 03/04/2021   Status post simultaneous kidney and pancreas transplant ([place]) 03/04/2021   Prophylactic antibiotic 03/04/2021   Immunosuppression ([place]) 03/04/2021   Severe nonproliferative diabetic retinopathy of left eye, without macular edema, associated with type 1 diabetes mellitus ([place]) 09/28/2019   Severe nonproliferative diabetic retinopathy of right eye, without macular edema, associated with type 1 diabetes mellitus ([place]) 09/28/2019   Nuclear sclerotic cataract of both eyes 09/28/2019   Chronic kidney disease (CKD) 08/19/2017   Anemia 05/27/2016   Hyperlipidemia 05/27/2016   Essential hypertension 05/27/2016   HIV disease ([place]) 04/02/2016   Past Surgical History:  Procedure Laterality Date   AV FISTULA PLACEMENT Left 07/12/2020   Procedure: LEFT ARM ARTERIOVENOUS (AV) BRACHIOCEPHALIC FISTULA CREATION;  Surgeon: Cherre Robins, MD;  Location: [facility] OR;  Service: Vascular;  Laterality: Left;   COMBINED KIDNEY-PANCREAS TRANSPLANT Bilateral 02/15/2021   WISDOM TOOTH EXTRACTION     Family History  Problem Relation Age of Onset   Diabetes Mother    Hypertension Mother    Hypertension Father    Cancer Maternal Uncle    Heart Problems Paternal Aunt    Hypertension Maternal Grandfather    Kidney disease Maternal Grandfather    Diabetes Paternal Grandmother    Outpatient Medications Prior to Visit  Medication Sig Dispense Refill   acetaminophen (TYLENOL) 500 MG tablet Take 1,000 mg by mouth every 6 (six) hours as needed.     amLODipine (NORVASC) 10 MG  tablet Take 10 mg by mouth at bedtime.     aspirin EC 81 MG tablet Take by mouth.     bictegravir-emtricitabine-tenofovir AF (BIKTARVY) 50-200-25 MG TABS tablet TAKE 1 TABLET BY MOUTH 1 TIME A DAY. 90 tablet 3   cetirizine (ZYRTEC) 10 MG tablet Take 10 mg by mouth daily as needed for allergies.     metoprolol tartrate (LOPRESSOR) 50 MG tablet Take 50 mg by mouth 2 (two) times  daily.     mycophenolate (CELLCEPT) 250 MG capsule TAKE 2 CAPSULES (500 MG TOTAL) BY MOUTH EVERY 12 (TWELVE) HOURS FOR 360 DAYS     predniSONE (DELTASONE) 5 MG tablet Take by mouth.     sulfamethoxazole-trimethoprim (BACTRIM DS) 800-160 MG tablet Take 1 tablet by mouth 3 (three) times a week.     tacrolimus (PROGRAF) 1 MG capsule SMARTSIG:5 Capsule(s) By Mouth Every 12 Hours     Insulin Glargine (BASAGLAR KWIKPEN) 100 UNIT/ML INJECT 32 UNITS TOTAL INTO THE SKIN EVERY MORNING. 45 mL 3   atorvastatin (LIPITOR) 10 MG tablet TAKE 1 TABLET (10 MG TOTAL) BY MOUTH ONCE DAILY AT 6 PM. (Patient not taking: No sig reported) 30 tablet 0   calcitRIOL (ROCALTROL) 0.25 MCG capsule Take 0.25 mcg by mouth daily.  (Patient not taking: Reported on 02/23/2022)  6   famotidine (PEPCID) 10 MG tablet Take 1 tablet by mouth daily.     furosemide (LASIX) 40 MG tablet Take 40 mg by mouth every Monday, Wednesday, and Friday.     hydrALAZINE (APRESOLINE) 25 MG tablet Take 25 mg by mouth 2 (two) times daily.     sodium bicarbonate 650 MG tablet Take 1,950 mg by mouth 2 (two) times daily.     No facility-administered medications prior to visit.   No Known Allergies    Objective:   Today's Vitals   02/23/22 1019  BP: 124/76  Pulse: 76  Temp: 98.1 F (36.7 C)  TempSrc: Temporal  SpO2: 96%  Weight: 180 lb 9.6 oz (81.9 kg)  Height: 6' (1.829 m)   Body mass index is 24.49 kg/m.   General: Well developed, well nourished. No acute distress. Lungs: Clear to auscultation bilaterally. No wheezing, rales or rhonchi. CV: RRR without murmurs or rubs. Pulses 2+ bilaterally. Feet- Skin intact. Old scarring on the dorsum of the feet from old ulcers. There are areas of thickened   and darkened callus on the plantar aspect of the right 4th and 5th toes. There is some thickening of   the left great toenail with discoloration and a partial separation of the nail along the lateral border. No   sign of maceration between toes.  Nails are normal. Dorsalis pedis and posterior tibial artery pulses   are normal. 5.07 monofilament testing normal. Psych: Alert and oriented. Normal mood and affect.  Health Maintenance Due  Topic Date Due   OPHTHALMOLOGY EXAM  Never done   Hepatitis C Screening  Never done   Diabetic kidney evaluation - Urine ACR  05/27/2017   COVID-19 Vaccine (4 - Pfizer risk series) 07/27/2020   HEMOGLOBIN A1C  01/30/2021   INFLUENZA VACCINE  12/16/2021   Diabetic kidney evaluation - GFR measurement  02/10/2022     Lab Results (12/17/2021)  Comprehensive Metabolic Panel  Sodium: 128 mEq/L  Potassium: 4.6 mEq/L  Chloride: 103 mEq/L  CO2:  24 mmol/L  Glucose: 90 mg/dL  BUN:  16 mg/dL  Creatinine: 1.77 mg/dL  eGFR:  49 mL/min/1.18m  Complete Blood Count:  WBC:  2.9 K/uL  RBC:  5.13 M/mm3  Hemoglobin: 13.2 gm/dL  Hematocrit: 42.6 %  MCV:  83 fL  MCH:  25.7 pg  MCHC:  31.0 g/dL  RDW:  15.9 fL  Platelets: 270 K/cumm  CD4 Helper: 365 HCV: Neg.  EKG: Normal sinus rhythm (rate= 70) with evidence for left atrial enlargement (notched p-wave in II, p-wave duration of 0.12 sec., negative p-wave in V1.  Assessment & Plan:   1. Type 1 diabetes mellitus with stage 5 chronic kidney disease not on chronic dialysis ([place]) I will check an A1c and glucose today. I will also screen for proteinuria. I recommend he follow up with his ophthalmologist for ongoing monitoring of his diabetic eye disease.  - Glucose, random - Hemoglobin A1c - Microalbumin / creatinine urine ratio  2. Stage 5 chronic kidney disease not on chronic dialysis ([facility]) Resolved s/p kidney transplant.  - Microalbumin / creatinine urine ratio  3. Status post simultaneous kidney and pancreas transplant ([place]) As noted above, this has significantly improved his renal function and resolved his diabetes. I reviewed [facility] notes and labs. He should continue mycophenolate mofetil 250 mg, 2 capsules bid, prednisone 5 mg  daily, and tacrolimus 1 mg bid. He will finish out a year of prophylactic Bactrim three days a week.  4. Essential hypertension Blood pressure at goal. Continue amlodipine 10 mg daily.  5. Severe nonproliferative diabetic retinopathy of left eye, without macular edema, associated with type 1 diabetes mellitus ([place]) 6. Severe nonproliferative diabetic retinopathy of right eye, without macular edema, associated with type 1 diabetes mellitus ([place]) Strongly encourage follow up with ophthalmologist.  7. HIV disease ([place]) Undetectable on Biktarvy 50-200-25 mg daily.  8. Hyperlipidemia, unspecified hyperlipidemia type I will check fasting lipids today.  - Lipid panel  9. Chest discomfort The chest pain sounds a bit atypical. However, in light of his Type 1 diabetes with microvascular complications, he would be at high risk for cardiovascular disease. I will refer him back to see Dr. Bettina Gavia to consider an echocardiogram and other workup for CV disease.  - EKG 12-Lead - Ambulatory referral to Cardiology  10. Callus of right 4th and 5th toes 11. Onychomycosis Plan to refer to podiatry to assess and manage findings on his foot exam.  - Ambulatory referral to Podiatry  12. Need for influenza vaccination  - Flu Vaccine QUAD 6+ mos PF IM (Fluarix Quad PF)  Return in about 3 months (around 05/26/2022) for Reassessment.   Haydee Salter, MD

## 2022-03-03 ENCOUNTER — Other Ambulatory Visit: Payer: Self-pay

## 2022-03-03 ENCOUNTER — Ambulatory Visit (INDEPENDENT_AMBULATORY_CARE_PROVIDER_SITE_OTHER): Payer: BC Managed Care – PPO | Admitting: Internal Medicine

## 2022-03-03 ENCOUNTER — Encounter: Payer: Self-pay | Admitting: Internal Medicine

## 2022-03-03 VITALS — BP 144/80 | HR 76 | Temp 98.5°F | Ht 71.0 in | Wt 183.0 lb

## 2022-03-03 DIAGNOSIS — D849 Immunodeficiency, unspecified: Secondary | ICD-10-CM | POA: Diagnosis not present

## 2022-03-03 DIAGNOSIS — Z113 Encounter for screening for infections with a predominantly sexual mode of transmission: Secondary | ICD-10-CM | POA: Diagnosis not present

## 2022-03-03 DIAGNOSIS — D84822 Immunodeficiency due to external causes: Secondary | ICD-10-CM

## 2022-03-03 DIAGNOSIS — B20 Human immunodeficiency virus [HIV] disease: Secondary | ICD-10-CM | POA: Diagnosis not present

## 2022-03-03 DIAGNOSIS — Z940 Kidney transplant status: Secondary | ICD-10-CM

## 2022-03-03 DIAGNOSIS — Z79899 Other long term (current) drug therapy: Secondary | ICD-10-CM

## 2022-03-03 DIAGNOSIS — Z9483 Pancreas transplant status: Secondary | ICD-10-CM

## 2022-03-03 MED ORDER — BIKTARVY 50-200-25 MG PO TABS: ORAL_TABLET | ORAL | 3 refills | Status: DC

## 2022-03-03 NOTE — Assessment & Plan Note (Signed)
He remains on immunsuppressive medications for his transplant.

## 2022-03-03 NOTE — Assessment & Plan Note (Signed)
He is doing well on Biktarvy and no concerns.  Labs reviewed with him.  Refills sent and he can continue with yearly follow up.

## 2022-03-03 NOTE — Progress Notes (Signed)
   Subjective:    Patient ID: Chase Rollins, male    DOB: Oct 15, 1980, 41 y.o.   MRN: [id number]  HPI Chase Rollins is here for routine follow up of HIV. Since his last visit last year, he has received a kidney + pancreas transplant.  He is feeling well and no new concerns.  He continues on [place] with no missed doses.     Review of Systems  Constitutional:  Negative for fatigue.  Gastrointestinal:  Negative for diarrhea.  Skin:  Negative for rash.       Objective:   Physical Exam Eyes:     General: No scleral icterus. Pulmonary:     Effort: Pulmonary effort is normal.  Neurological:     General: No focal deficit present.     Mental Status: He is alert.  Psychiatric:        Mood and Affect: Mood normal.   SH: no tobacco        Assessment & Plan:

## 2022-03-03 NOTE — Assessment & Plan Note (Signed)
Screened negative 

## 2022-03-04 ENCOUNTER — Ambulatory Visit: Payer: BC Managed Care – PPO | Admitting: Cardiology

## 2022-03-04 NOTE — Progress Notes (Deleted)
Cardiology Office Note:    Date:  03/04/2022   ID:  Chase Rollins, DOB 1981-05-18, MRN [id number]  PCP:  Haydee Salter, MD  Cardiologist:  Shirlee More, MD  Electrophysiologist:  None   Referring MD: Haydee Salter, MD   Chief Complaint: chest pain  History of Present Illness:    Chase Rollins is a 41 y.o. male with a history of hypertension, hyperlipidemia, and type 1 diabetes mellitus, CKD stage V s/p simultaneous pancreas and kidney transplant in 02/2021, and HIV who is followed by Dr. Bettina Gavia and presents today for evaluation of chest pain.  Patient was referred to Dr. Bettina Gavia in 10/2018 for further evaluation of a heart murmur. He had no known heart disease at that time and was asymptomatic from a cardiac standpoint. He was noted to have a soft murmur at recent visit with Nephrology. However, no murmur was appreciated at visit with Dr. Bettina Gavia. Of note, he was in the process of being worked up for renal transplant at [facility] at that time. An Echo was ordered for further evaluation and showed LVEF of 60-65% with normal diastolic function and no significant valvular disease. He underwent an Echo stress test in 03/2019 at [facility] as part of work-up for transplant and this was negative. Patient ultimately underwent a simultaneous pancreas and kidney transplant in 02/2021.   Patient presents today for ***  Chest Pain Patient reports ***. Echo stress test in 02/2021 as part of work-up for renal transplant was negative.  - ***  Hypertension BP *** - Continue Amlodipine 10mg  daily and Lopressor 50mg  twice daily.  Hyperlipidemia Recent lipid panel on 02/23/2022: Total Cholesterol 182, Triglycerides 126, HDL 57.3, LDL 100. LDL as high as 184 in the past. - Not on any medications at this time. ***  Type 1 Diabetes S/p simultaneous pancreas and renal transplant in 02/2021 at [facility]. - No longer on Insulin.  - Followed by Endocrinology.   CKD Stage V s/p Renal Transplant  S/p  simultaneous pancreas and renal transplant in 02/2021 at [facility].  - Most recent creatinine 1.77 (GFR 49) in 12/2021 which is around baseline. - Maintained on Biktarvy, Prograf, Cellcept, , Prednisone, and Bactrim.  - Followed by Nephrology at [facility].   Past Medical History:  Diagnosis Date   Anemia    Anemia associated with chronic renal failure    Chronic kidney disease    Stage IV,  [facility] follows    Diabetes ([place])    Type I - per Endocrinologist    HIV (human immunodeficiency virus infection) ([place])    HTN (hypertension)    Pneumonia     Past Surgical History:  Procedure Laterality Date   AV FISTULA PLACEMENT Left 07/12/2020   Procedure: LEFT ARM ARTERIOVENOUS (AV) BRACHIOCEPHALIC FISTULA CREATION;  Surgeon: Cherre Robins, MD;  Location: [facility];  Service: Vascular;  Laterality: Left;   COMBINED KIDNEY-PANCREAS TRANSPLANT Bilateral 02/15/2021   WISDOM TOOTH EXTRACTION      Current Medications: No outpatient medications have been marked as taking for the 03/11/22 encounter (Appointment) with Darreld Mclean, PA-C.     Allergies:   Patient has no known allergies.   Social History   Socioeconomic History   Marital status: Married    Spouse name: Corene Cornea   Number of children: 0   Years of education: Not on file   Highest education level: Bachelor's degree (e.g., BA, AB, BS)  Occupational History   Occupation: Radio broadcast assistant    Comment: [employer]  Tobacco Use   Smoking status: Never   Smokeless tobacco: Never  Vaping Use   Vaping Use: Never used  Substance and Sexual Activity   Alcohol use: Yes    Comment: socially   Drug use: No   Sexual activity: Yes    Partners: Male    Comment: declined condoms  Other Topics Concern   Not on file  Social History Narrative   Not on file   Social Determinants of Health   Financial Resource Strain: Not on file  Food Insecurity: Not on file  Transportation Needs: Not on file  Physical Activity: Not  on file  Stress: Not on file  Social Connections: Not on file     Family History: The patient's family history includes Cancer in his maternal uncle; Diabetes in his mother and paternal grandmother; Heart Problems in his paternal aunt; Hypertension in his father, maternal grandfather, and mother; Kidney disease in his maternal grandfather.  ROS:   Please see the history of present illness.     EKGs/Labs/Other Studies Reviewed:    The following studies were reviewed:  Echocardiogram 11/23/2018: Impressions:  1. The left ventricle has normal systolic function with an ejection  fraction of 60-65%. The cavity size was normal. There is mildly increased  left ventricular wall thickness. Left ventricular diastolic parameters  were normal.   2. The right ventricle has normal systolic function. The cavity was  normal. There is no increase in right ventricular wall thickness.  _______________  Echocardiogram Stress Test 03/31/2019 ([facility]): Interpretation: NORMAL STRESS TEST.  NORMAL LA PRESSURES WITH NORMAL DIASTOLIC FUNCTION  VALVULAR REGURGITATION: TRIVIAL MR, TRIVIAL TR  NO VALVULAR STENOSIS  Maximum workload of 12.10 METs was achieved during exercise.  RESTING HYPERTENSION - EXAGGERATED RESPONSE   EKG:  EKG ordered today. EKG personally reviewed and demonstrates ***.  Recent Labs: No results found for requested labs within last 365 days.  Recent Lipid Panel    Component Value Date/Time   CHOL 182 02/23/2022 1604   CHOL 135 09/27/2017 1002   TRIG 126.0 02/23/2022 1604   HDL 57.30 02/23/2022 1604   HDL 40 09/27/2017 1002   CHOLHDL 3 02/23/2022 1604   VLDL 25.2 02/23/2022 1604   LDLCALC 100 (H) 02/23/2022 1604   LDLCALC 126 (H) 12/18/2019 1155    Physical Exam:    Vital Signs: There were no vitals taken for this visit.    Wt Readings from Last 3 Encounters:  03/03/22 183 lb (83 kg)  02/23/22 180 lb 9.6 oz (81.9 kg)  02/24/21 180 lb (81.6 kg)     General: 41 y.o. male  in no acute distress. HEENT: Normocephalic and atraumatic. Sclera clear. EOMs intact. Neck: Supple. No carotid bruits. No JVD. Heart: *** RRR. Distinct S1 and S2. No murmurs, gallops, or rubs. Radial and distal pedal pulses 2+ and equal bilaterally. Lungs: No increased work of breathing. Clear to ausculation bilaterally. No wheezes, rhonchi, or rales.  Abdomen: Soft, non-distended, and non-tender to palpation. Bowel sounds present in all 4 quadrants.  MSK: Normal strength and tone for age. *** Extremities: No lower extremity edema.    Skin: Warm and dry. Neuro: Alert and oriented x3. No focal deficits. Psych: Normal affect. Responds appropriately.   Assessment:    No diagnosis found.  Plan:     Disposition: Follow up in ***   Medication Adjustments/Labs and Tests Ordered: Current medicines are reviewed at length with the patient today.  Concerns regarding medicines are outlined above.  No orders of the defined types were placed in this encounter.  No orders of the defined types were placed in this encounter.   There are no Patient Instructions on file for this visit.   Signed, Darreld Mclean, PA-C  03/04/2022 12:42 PM    [facility]

## 2022-03-09 ENCOUNTER — Ambulatory Visit: Payer: PRIVATE HEALTH INSURANCE | Admitting: Podiatry

## 2022-03-09 ENCOUNTER — Ambulatory Visit (INDEPENDENT_AMBULATORY_CARE_PROVIDER_SITE_OTHER): Payer: PRIVATE HEALTH INSURANCE | Admitting: Podiatry

## 2022-03-09 DIAGNOSIS — E1069 Type 1 diabetes mellitus with other specified complication: Secondary | ICD-10-CM

## 2022-03-10 ENCOUNTER — Telehealth: Payer: Self-pay

## 2022-03-10 ENCOUNTER — Encounter: Payer: Self-pay | Admitting: Internal Medicine

## 2022-03-10 ENCOUNTER — Encounter: Payer: Self-pay | Admitting: Family Medicine

## 2022-03-10 ENCOUNTER — Other Ambulatory Visit (HOSPITAL_COMMUNITY): Payer: Self-pay

## 2022-03-10 NOTE — Telephone Encounter (Signed)
[facility] Patient Advocate Encounter   Was successful in obtaining a [employer] copay card for Biktarvy.  This copay card will make the patients copay $0.00.  I have spoken with the patient.    The billing information is as follows and has been shared with [facility].      Chase Rollins, CPhT [facility] Patient Advocate [facility] Phone: [phone] Fax:  [phone]  

## 2022-03-11 ENCOUNTER — Ambulatory Visit: Payer: BC Managed Care – PPO | Admitting: Student

## 2022-03-11 NOTE — Progress Notes (Signed)
Subjective:   Patient ID: Chase Rollins, male   DOB: 40 y.o.   MRN: [id number]   HPI Patient presents with long-term diabetes and he had a pancreatic kidney transplant and just presents for evaluation and to make sure no foot issues have occurred during his years of care having diabetes and periods of not good control.  Patient does not smoke likes to be active   Review of Systems  All other systems reviewed and are negative.       Objective:  Physical Exam Vitals and nursing note reviewed.  Constitutional:      Appearance: He is well-developed.  Pulmonary:     Effort: Pulmonary effort is normal.  Musculoskeletal:        General: Normal range of motion.  Skin:    General: Skin is warm.  Neurological:     Mental Status: He is alert.     Neurovascular status found to be intact muscle strength was found to be adequate range of motion adequate.  Patient is found to have relatively healthy skin and I did not note any pathology currently with good digital perfusion and digital sensation.     Assessment:  Patient appears to be doing well after having had a pancreatic transplant with A1c now running normal     Plan:  Reviewed condition recommended that he continue with conservative care daily inspections but I do not see any damage to his feet that have occurred during the period of having diabetes for a number of years.  Encouraged to call questions concerns

## 2022-03-12 NOTE — Progress Notes (Unsigned)
Cardiology Office Note:    Date:  03/13/2022   ID:  Job Terlizzi Rollins, DOB 10-15-1980, MRN [id number]  PCP:  Haydee Salter, MD  Cardiologist:  Shirlee More, MD  Electrophysiologist:  None   Referring MD: Haydee Salter, MD   Chief Complaint: chest pain  History of Present Illness:    Chase Rollins is a 41 y.o. male with a history of hypertension, hyperlipidemia, and type 1 diabetes mellitus, CKD stage V s/p simultaneous pancreas and kidney transplant in 02/2021, and HIV who is followed by Dr. Bettina Gavia and presents today for evaluation of chest pain.  Patient was referred to Dr. Bettina Gavia in 10/2018 for further evaluation of a heart murmur. He had no known heart disease at that time and was asymptomatic from a cardiac standpoint. He was noted to have a soft murmur at recent visit with Nephrology. However, no murmur was appreciated at visit with Dr. Bettina Gavia. Of note, he was in the process of being worked up for renal transplant at [facility] at that time. An Echo was ordered for further evaluation and showed LVEF of 60-65% with normal diastolic function and no significant valvular disease. He underwent an Echo stress test in 03/2019 at [facility] as part of work-up for transplant and this was negative. Patient ultimately underwent a simultaneous pancreas and kidney transplant in 02/2021.   Patient presents today for evaluation of chest pain.  He reports some vague chest pain that he has had intermittently over the last year. He states it is hard to describe but then does say it feel like a pressure. This occurs at rest primarily when he is at work and stressed. He works at [employer]. He recently switched jobs and had about 1 week off in between jobs and noticed improved in symptoms when he was not working. Symptoms have returned with new job but not as severe. He denies any exertional chest pain. No shortness of breath, orthopnea, PND, palpitations, lightheadedness, dizziness, or  syncope.   Past Medical History:  Diagnosis Date   Anemia    Anemia associated with chronic renal failure    Chronic kidney disease    Stage IV,  [facility] follows    Diabetes ([place])    Type I - per Endocrinologist    HIV (human immunodeficiency virus infection) ([place])    HTN (hypertension)    Pneumonia     Past Surgical History:  Procedure Laterality Date   AV FISTULA PLACEMENT Left 07/12/2020   Procedure: LEFT ARM ARTERIOVENOUS (AV) BRACHIOCEPHALIC FISTULA CREATION;  Surgeon: Cherre Robins, MD;  Location: [facility] OR;  Service: Vascular;  Laterality: Left;   COMBINED KIDNEY-PANCREAS TRANSPLANT Bilateral 02/15/2021   WISDOM TOOTH EXTRACTION      Current Medications: Current Meds  Medication Sig   acetaminophen (TYLENOL) 500 MG tablet Take 1,000 mg by mouth every 6 (six) hours as needed.   amLODipine (NORVASC) 10 MG tablet Take 10 mg by mouth at bedtime.   aspirin EC 81 MG tablet Take by mouth.   atorvastatin (LIPITOR) 10 MG tablet Take 1 tablet (10 mg total) by mouth daily.   bictegravir-emtricitabine-tenofovir AF (BIKTARVY) 50-200-25 MG TABS tablet TAKE 1 TABLET BY MOUTH 1 TIME A DAY.   carvedilol (COREG) 12.5 MG tablet Take 1 tablet (12.5 mg total) by mouth 2 (two) times daily.   cetirizine (ZYRTEC) 10 MG tablet Take 10 mg by mouth daily as needed for allergies.   mycophenolate (CELLCEPT) 250 MG capsule TAKE 2 CAPSULES (500  MG TOTAL) BY MOUTH EVERY 12 (TWELVE) HOURS FOR 360 DAYS   predniSONE (DELTASONE) 5 MG tablet Take by mouth.   tacrolimus (PROGRAF) 1 MG capsule SMARTSIG:5 Capsule(s) By Mouth Every 12 Hours   [DISCONTINUED] metoprolol tartrate (LOPRESSOR) 50 MG tablet Take 50 mg by mouth 2 (two) times daily.     Allergies:   Patient has no known allergies.   Social History   Socioeconomic History   Marital status: Married    Spouse name: Corene Cornea   Number of children: 0   Years of education: Not on file   Highest education level: Bachelor's degree (e.g., BA, AB,  BS)  Occupational History   Occupation: Radio broadcast assistant    Comment: Office manager  Tobacco Use   Smoking status: Never   Smokeless tobacco: Never  Vaping Use   Vaping Use: Never used  Substance and Sexual Activity   Alcohol use: Yes    Comment: socially   Drug use: No   Sexual activity: Yes    Partners: Male    Comment: declined condoms  Other Topics Concern   Not on file  Social History Narrative   Not on file   Social Determinants of Health   Financial Resource Strain: Not on file  Food Insecurity: Not on file  Transportation Needs: Not on file  Physical Activity: Not on file  Stress: Not on file  Social Connections: Not on file     Family History: The patient's family history includes Cancer in his maternal uncle; Diabetes in his mother and paternal grandmother; Heart Problems in his paternal aunt; Hypertension in his father, maternal grandfather, and mother; Kidney disease in his maternal grandfather.  ROS:   Please see the history of present illness.     EKGs/Labs/Other Studies Reviewed:    The following studies were reviewed:  Echocardiogram 11/23/2018: Findings: - Left Ventricle: The left ventricle has normal systolic function, with an  ejection fraction of 60-65%. The cavity size was normal. There is mildly  increased left ventricular wall thickness. Left ventricular diastolic  parameters were normal.  - Right Ventricle: The right ventricle has normal systolic function. The  cavity was normal. There is no increase in right ventricular wall  thickness.  - Left Atrium: Left atrial size was normal in size.  - Right Atrium: Right atrial size was normal in size. Right atrial pressure  is estimated at 3 mmHg.  - Interatrial Septum: No atrial level shunt detected by color flow Doppler.  - Pericardium: There is no evidence of pericardial effusion.  - Mitral Valve: The mitral valve is normal in structure. Mitral valve  regurgitation is trivial by color  flow Doppler.  - Tricuspid Valve: The tricuspid valve is normal in structure. Tricuspid  valve regurgitation is mild by color flow Doppler.  - Aortic Valve: The aortic valve is normal in structure. Aortic valve  regurgitation was not assessed by color flow Doppler.  - Pulmonic Valve: The pulmonic valve was normal in structure. Pulmonic valve  regurgitation was not assessed by color flow Doppler.  - Venous: The inferior vena cava measures 2.27 cm, is normal in size with  greater than 50% respiratory variability. _______________   Echo Stress Test 03/31/2019 ([facility]): Interpretation: NORMAL STRESS TEST.  NORMAL LA PRESSURES WITH NORMAL DIASTOLIC FUNCTION  VALVULAR REGURGITATION: TRIVIAL MR, TRIVIAL TR  NO VALVULAR STENOSIS  Maximum workload of 12.10 METs was achieved during exercise.  RESTING HYPERTENSION - EXAGGERATED RESPONSE    EKG:  EKG ordered today. EKG personally  reviewed and demonstrates normal sinus rhythm, rate 73 bpm, with elevated J point with upsloping ST segment in V2-V4 which looks like early repolarization.  No significant changes compared to prior tracings.   Recent Labs: No results found for requested labs within last 365 days.  Recent Lipid Panel    Component Value Date/Time   CHOL 182 02/23/2022 1604   CHOL 135 09/27/2017 1002   TRIG 126.0 02/23/2022 1604   HDL 57.30 02/23/2022 1604   HDL 40 09/27/2017 1002   CHOLHDL 3 02/23/2022 1604   VLDL 25.2 02/23/2022 1604   LDLCALC 100 (H) 02/23/2022 1604   LDLCALC 126 (H) 12/18/2019 1155    Physical Exam:    Vital Signs: BP 138/80   Pulse 73   Ht 5\' 11"  (1.803 m)   Wt 180 lb 6.4 oz (81.8 kg)   SpO2 96%   BMI 25.16 kg/m     Wt Readings from Last 3 Encounters:  03/13/22 180 lb 6.4 oz (81.8 kg)  03/03/22 183 lb (83 kg)  02/23/22 180 lb 9.6 oz (81.9 kg)     General: 41 y.o. African-American male in no acute distress. HEENT: Normocephalic and atraumatic. Sclera clear. EOMs intact. Neck: Supple.  No  JVD. Heart: RRR. Distinct S1 and S2. No murmurs, gallops, or rubs. Radial pulses 2+ and equal bilaterally. Lungs: No increased work of breathing. Clear to ausculation bilaterally. No wheezes, rhonchi, or rales.  Abdomen: Soft, non-distended, and non-tender to palpation.  MSK: Normal strength and tone for age.  Extremities: No lower extremity edema.    Skin: Warm and dry. Neuro: Alert and oriented x3. No focal deficits. Psych: Normal affect. Responds appropriately.   Assessment:    1. Chest pain   2. Primary hypertension   3. Hyperlipidemia, unspecified hyperlipidemia type   4. Type 1 diabetes mellitus with complications (HCC)   5. Chronic kidney disease (CKD), stage V ([place])   6. S/P pancreas and kidney transplant     Plan:    Chest Pain Patient reports chest pressure at rest, primarily when he is stressed at work. No exertional symptoms.. Echo stress test in 02/2021 as part of work-up for renal transplant was negative.  - EKG shows some J point/ upsloping ST segment in anterior leads consistent with early repolarization. No significant changes compared to prior tracings.  - Symptoms sound atypical to me and very well could just be due to stress related to work. However, he has multiple CV risk factors including type 1 diabetes which he was diagnosed with at the age of 25. Therefore, will order cardiac PET. Considered coronary CTA; however, patient had a renal transplant last year and creatinine was 1.88 on most recent labs last week so would prefer not to use contrast dye.   Hypertension BP mildly elevated. Initially 142/68 and then 138/80 on my personal recheck at the end of visit. He states this is where it normally is at home. - Currently on Amlodipine 10mg  daily and Lopressor 50mg  twice daily. Will stop Lopressor and switch to Coreg 12.5mg  twice daily. Continue current dose of Amlodipine. - Advised patient to let us know if BP remains over 130/80 with this change.    Hyperlipidemia Recent lipid panel on 02/23/2022: Total Cholesterol 182, Triglycerides 126, HDL 57.3, LDL 100. LDL as high as 184 in the past. - Not on any medications at this time. He states he was previously on a statin but then this was stopped.  - ASCVD 10 year risk can only be  calculated for patients between 40 and 79 and he is 47. However, if I enter his age as 54, 11 year risk is 10.9%. Therefore, I would recommend we restart a statin. Will add Lipitor 10mg  daily and then can repeat lipid panel and LFTs in about 2 months.   Type 1 Diabetes S/p simultaneous pancreas and renal transplant in 02/2021 at [facility]. - No longer on Insulin.  - Followed by Endocrinology.   CKD Stage V s/p Renal Transplant  S/p simultaneous pancreas and renal transplant in 02/2021 at [facility].  - Most recent creatinine 1.77 (GFR 49) in 12/2021 which is around baseline. - Maintained on Biktarvy, Prograf, Cellcept, , Prednisone, and Bactrim.  - Followed by Nephrology at [facility].   Disposition: Follow up in 6 months.   Medication Adjustments/Labs and Tests Ordered: Current medicines are reviewed at length with the patient today.  Concerns regarding medicines are outlined above.  Orders Placed This Encounter  Procedures   NM PET CT CARDIAC PERFUSION MULTI W/ABSOLUTE BLOODFLOW   Lipid panel   Hepatic function panel   EKG 12-Lead   Meds ordered this encounter  Medications   atorvastatin (LIPITOR) 10 MG tablet    Sig: Take 1 tablet (10 mg total) by mouth daily.    Dispense:  90 tablet    Refill:  3   carvedilol (COREG) 12.5 MG tablet    Sig: Take 1 tablet (12.5 mg total) by mouth 2 (two) times daily.    Dispense:  180 tablet    Refill:  3    Stop the Metoprolol    Patient Instructions  Medication Instructions:  STOP the Metoprolol  Start Atorvastatin 10 mg once daily  START Carvedilol 12.5 mg twice daily  *If you need a refill on your cardiac medications before your next appointment, please call your  pharmacy*   Lab Work: Your provider would like for you to return in 2 months  to have the following labs drawn: fasting lipid and liver. You do not need an appointment for the lab. Once in our office lobby there is a podium where you can sign in and ring the doorbell to alert [place] that you are here. The lab is open from 8:00 am to 4 pm; closed for lunch from 12:45pm-1:45pm.  You may also go to any of these [facility] locations:   [place] ([place]) - [place] [address] [place] [address] [place] - [address] ([facility]  If you have labs (blood work) drawn today and your tests are completely normal, you will receive your results only by: [employer] (if you have MyChart) OR A paper copy in the mail If you have any lab test that is abnormal or we need to change your treatment, we will call you to review the results.   Testing/Procedures: CARDIAC PET- Your physician has requested that you have a Cardiac Pet Stress Test. This testing is completed at [facility] ([place], [place] [address]). The schedulers will call you to get this scheduled. Please follow instructions below and call the office with any questions/concerns [phone]).   Follow-Up: At [facility], you and your  health needs are our priority.  As part of our continuing mission to provide you with exceptional heart care, we have created designated Provider Care Teams.  These Care Teams include your primary Cardiologist (physician) and Advanced Practice Providers (APPs -  Physician Assistants and Nurse Practitioners) who all work together to provide you with the care you need, when you need it.  We recommend  signing up for the patient portal called "MyChart".  Sign up information is provided on this After Visit Summary.  MyChart is used to connect with patients for Virtual Visits (Telemedicine).  Patients are able to view lab/test results, encounter notes, upcoming appointments, etc.  Non-urgent messages can be sent to your provider as well.   To learn more about what you can do with MyChart, go to [email or web address].    Your next appointment:   6 month(s)  The format for your next appointment:   In Person  Provider:   Shirlee More, MD  or Sande Rives, PA  Other Instructions How to Prepare for Your Cardiac PET/CT Stress Test:  1. Please do not take these medications before your test:   Medications that may interfere with the cardiac pharmacological stress agent (ex. nitrates - including erectile dysfunction medications or beta-blockers) the day of the exam. (Erectile dysfunction medication should be held for at least 72 hrs prior to test) Theophylline containing medications for 12 hours. Dipyridamole 48 hours prior to the test. Your remaining medications may be taken with water.  2. Nothing to eat or drink, except water, 3 hours prior to arrival time.   NO caffeine/decaffeinated products, or chocolate 12 hours prior to arrival.  3. NO perfume, cologne or lotion  4. Total time is 1 to 2 hours; you may want to bring reading material for the waiting time.  5. Please report to Admitting at the [facility] Main Entrance 60 minutes early for your test.  [place], [place] [address]  Diabetic Preparation:  Hold oral medications. You may take NPH and Lantus insulin. Do not take Humalog or Humulin R (Regular Insulin) the day of your test. Check blood sugars prior to leaving the house. If able to eat breakfast prior to 3 hour fasting, you may take all medications, including your insulin, Do not worry if you miss your breakfast dose of insulin - start at your  next meal.  IF YOU THINK YOU MAY BE PREGNANT, OR ARE NURSING PLEASE INFORM THE TECHNOLOGIST.  In preparation for your appointment, medication and supplies will be purchased.  Appointment availability is limited, so if you need to cancel or reschedule, please call the Radiology Department at [phone]  24 hours in advance to avoid a cancellation fee of $100.00  What to Expect After you Arrive:  Once you arrive and check in for your appointment, you will be taken to a preparation room within the Radiology Department.  A technologist or Nurse will obtain your medical history, verify that you are correctly prepped for the exam, and explain the procedure.  Afterwards,  an IV will be started in your arm and electrodes will be placed on your skin for EKG monitoring during the stress portion of the exam. Then you will be escorted to the PET/CT scanner.  There, staff will get you positioned on the scanner and obtain a blood pressure and EKG.  During the exam, you will continue to be connected to the EKG and blood pressure machines.  A small, safe amount of a radioactive tracer  will be injected in your IV to obtain a series of pictures of your heart along with an injection of a stress agent.    After your Exam:  It is recommended that you eat a meal and drink a caffeinated beverage to counter act any effects of the stress agent.  Drink plenty of fluids for the remainder of the day and urinate frequently for the first couple of hours after the exam.  Your doctor will inform you of your test results within 7-10 business days.  For questions about your test or how to prepare for your test, please call: Marchia Bond, Cardiac Imaging Nurse Navigator  Gordy Clement, Cardiac Imaging Nurse Navigator Office: [phone]     Signed, Eppie Gibson  03/14/2022 9:53 AM    [place]

## 2022-03-13 ENCOUNTER — Ambulatory Visit: Payer: Self-pay | Attending: Cardiology | Admitting: Student

## 2022-03-13 ENCOUNTER — Encounter: Payer: Self-pay | Admitting: Student

## 2022-03-13 VITALS — BP 138/80 | HR 73 | Ht 71.0 in | Wt 180.4 lb

## 2022-03-13 DIAGNOSIS — I10 Essential (primary) hypertension: Secondary | ICD-10-CM

## 2022-03-13 DIAGNOSIS — E785 Hyperlipidemia, unspecified: Secondary | ICD-10-CM

## 2022-03-13 DIAGNOSIS — R079 Chest pain, unspecified: Secondary | ICD-10-CM

## 2022-03-13 DIAGNOSIS — N185 Chronic kidney disease, stage 5: Secondary | ICD-10-CM

## 2022-03-13 DIAGNOSIS — R072 Precordial pain: Secondary | ICD-10-CM

## 2022-03-13 DIAGNOSIS — E108 Type 1 diabetes mellitus with unspecified complications: Secondary | ICD-10-CM

## 2022-03-13 DIAGNOSIS — Z940 Kidney transplant status: Secondary | ICD-10-CM

## 2022-03-13 MED ORDER — ATORVASTATIN CALCIUM 10 MG PO TABS: 10.0000 mg | ORAL_TABLET | Freq: Every day | ORAL | 3 refills | Status: DC

## 2022-03-13 MED ORDER — CARVEDILOL 12.5 MG PO TABS: 12.5000 mg | ORAL_TABLET | Freq: Two times a day (BID) | ORAL | 3 refills | Status: DC

## 2022-03-13 NOTE — Patient Instructions (Addendum)
Medication Instructions:  STOP the Metoprolol  Start Atorvastatin 10 mg once daily  START Carvedilol 12.5 mg twice daily  *If you need a refill on your cardiac medications before your next appointment, please call your pharmacy*   Lab Work: Your provider would like for you to return in 2 months  to have the following labs drawn: fasting lipid and liver. You do not need an appointment for the lab. Once in our office lobby there is a podium where you can sign in and ring the doorbell to alert [place] that you are here. The lab is open from 8:00 am to 4 pm; closed for lunch from 12:45pm-1:45pm.  You may also go to any of these [facility] locations:   [place] ([place]) - [place] [address] [place] [address] - [address] [place] - [address] ([facility]  If you have labs (blood work) drawn today and your tests are completely normal, you will receive your results only by: [employer] (if you have MyChart) OR A paper copy in the mail If you have any lab test that is abnormal or we need to change your treatment, we will call you to review the results.   Testing/Procedures: CARDIAC PET- Your physician has requested that you have a Cardiac Pet Stress Test. This testing is completed at [facility] ([place], [place] [address]). The schedulers will call you to get this scheduled. Please follow instructions below and call the office with any questions/concerns [phone]).   Follow-Up: At [facility], you and your health needs are our priority.  As part of our continuing mission to provide you with exceptional heart care, we have created designated Provider Care Teams.  These Care Teams  include your primary Cardiologist (physician) and Advanced Practice Providers (APPs -  Physician Assistants and Nurse Practitioners) who all work together to provide you with the care you need, when you need it.  We recommend signing up for the patient portal called "MyChart".  Sign up information is provided on this After Visit Summary.  MyChart is used to connect with patients for Virtual Visits (Telemedicine).  Patients are able to view lab/test results, encounter notes, upcoming appointments, etc.  Non-urgent messages can be sent to your provider as well.   To learn more about what you can do with MyChart, go to [email or web address].    Your next appointment:   6 month(s)  The format for your next appointment:   In Person  Provider:   Shirlee More, MD  or Sande Rives, PA  Other Instructions How to Prepare for Your Cardiac PET/CT Stress Test:  1. Please do not take these medications before your test:   Medications that may interfere with the cardiac pharmacological stress agent (ex. nitrates - including erectile dysfunction medications or beta-blockers) the day of the exam. (Erectile dysfunction medication should be held for at least 72 hrs prior to test) Theophylline containing medications for 12 hours. Dipyridamole 48 hours prior to the test. Your remaining medications may be taken with water.  2. Nothing to eat or drink, except water, 3 hours prior to arrival time.   NO caffeine/decaffeinated products, or chocolate  12 hours prior to arrival.  3. NO perfume, cologne or lotion  4. Total time is 1 to 2 hours; you may want to bring reading material for the waiting time.  5. Please report to Admitting at the [facility] Main Entrance 60 minutes early for your test.  [place], [place] [address]  Diabetic Preparation:  Hold oral medications. You may take NPH and Lantus insulin. Do not take Humalog or Humulin R (Regular Insulin) the day of your  test. Check blood sugars prior to leaving the house. If able to eat breakfast prior to 3 hour fasting, you may take all medications, including your insulin, Do not worry if you miss your breakfast dose of insulin - start at your next meal.  IF YOU THINK YOU MAY BE PREGNANT, OR ARE NURSING PLEASE INFORM THE TECHNOLOGIST.  In preparation for your appointment, medication and supplies will be purchased.  Appointment availability is limited, so if you need to cancel or reschedule, please call the Radiology Department at [phone]  24 hours in advance to avoid a cancellation fee of $100.00  What to Expect After you Arrive:  Once you arrive and check in for your appointment, you will be taken to a preparation room within the Radiology Department.  A technologist or Nurse will obtain your medical history, verify that you are correctly prepped for the exam, and explain the procedure.  Afterwards,  an IV will be started in your arm and electrodes will be placed on your skin for EKG monitoring during the stress portion of the exam. Then you will be escorted to the PET/CT scanner.  There, staff will get you positioned on the scanner and obtain a blood pressure and EKG.  During the exam, you will continue to be connected to the EKG and blood pressure machines.  A small, safe amount of a radioactive tracer will be injected in your IV to obtain a series of pictures of your heart along with an injection of a stress agent.    After your Exam:  It is recommended that you eat a meal and drink a caffeinated beverage to counter act any effects of the stress agent.  Drink plenty of fluids for the remainder of the day and urinate frequently for the first couple of hours after the exam.  Your doctor will inform you of your test results within 7-10 business days.  For questions about your test or how to prepare for your test, please call: Marchia Bond, Cardiac Imaging Nurse Navigator  Gordy Clement, Cardiac Imaging  Nurse Navigator Office: [phone]

## 2022-03-14 ENCOUNTER — Encounter: Payer: Self-pay | Admitting: Student

## 2022-03-31 ENCOUNTER — Other Ambulatory Visit: Payer: Self-pay | Admitting: Internal Medicine

## 2022-03-31 DIAGNOSIS — B20 Human immunodeficiency virus [HIV] disease: Secondary | ICD-10-CM

## 2022-07-03 ENCOUNTER — Encounter (HOSPITAL_COMMUNITY): Payer: Self-pay | Admitting: Student

## 2022-07-17 ENCOUNTER — Telehealth (HOSPITAL_COMMUNITY): Payer: Self-pay | Admitting: Student

## 2022-07-17 NOTE — Telephone Encounter (Signed)
Thank you for letting me know.  Darreld Mclean, PA-C 07/17/2022 5:27 PM

## 2022-07-17 NOTE — Telephone Encounter (Signed)
Just an FYI. We have made several attempts to contact this patient including sending a letter to schedule or reschedule their Cardiac PET Scan. We will be removing the patient from the CT  WQ.   07/03/22 MAILED LETTER [facility]  07/03/22 [facility] to schedule @ 12:57/LBW  left vm; LW11/21'@1030'$    If patient calls back to schedule we can reinstate orders. Thank you.       Thank you

## 2023-02-23 ENCOUNTER — Other Ambulatory Visit: Payer: Self-pay

## 2023-02-25 ENCOUNTER — Other Ambulatory Visit: Payer: Self-pay | Admitting: Internal Medicine

## 2023-02-25 DIAGNOSIS — B20 Human immunodeficiency virus [HIV] disease: Secondary | ICD-10-CM

## 2023-02-26 ENCOUNTER — Other Ambulatory Visit: Payer: Self-pay

## 2023-02-26 ENCOUNTER — Other Ambulatory Visit: Payer: BC Managed Care – PPO

## 2023-02-26 DIAGNOSIS — B20 Human immunodeficiency virus [HIV] disease: Secondary | ICD-10-CM

## 2023-02-26 DIAGNOSIS — Z113 Encounter for screening for infections with a predominantly sexual mode of transmission: Secondary | ICD-10-CM

## 2023-02-27 LAB — C. TRACHOMATIS/N. GONORRHOEAE RNA
C. trachomatis RNA, TMA: NOT DETECTED
N. gonorrhoeae RNA, TMA: NOT DETECTED

## 2023-03-01 LAB — T-HELPER CELLS (CD4) COUNT (NOT AT ARMC)
Absolute CD4: 722 {cells}/uL (ref 490–1740)
CD4 T Helper %: 45 % (ref 30–61)
Total lymphocyte count: 1586 {cells}/uL (ref 850–3900)

## 2023-03-01 LAB — HIV-1 RNA QUANT-NO REFLEX-BLD
HIV 1 RNA Quant: NOT DETECTED {copies}/mL
HIV-1 RNA Quant, Log: NOT DETECTED {Log_copies}/mL

## 2023-03-01 LAB — RPR: RPR Ser Ql: NONREACTIVE

## 2023-03-09 ENCOUNTER — Ambulatory Visit: Payer: Self-pay | Admitting: Internal Medicine

## 2023-03-12 ENCOUNTER — Ambulatory Visit (INDEPENDENT_AMBULATORY_CARE_PROVIDER_SITE_OTHER): Payer: BC Managed Care – PPO | Admitting: Internal Medicine

## 2023-03-12 ENCOUNTER — Encounter: Payer: Self-pay | Admitting: Internal Medicine

## 2023-03-12 ENCOUNTER — Other Ambulatory Visit (HOSPITAL_COMMUNITY)
Admission: RE | Admit: 2023-03-12 | Discharge: 2023-03-12 | Disposition: A | Discharge status: Home / Self Care | Payer: BC Managed Care – PPO | Source: Ambulatory Visit | Attending: Internal Medicine | Admitting: Internal Medicine

## 2023-03-12 ENCOUNTER — Other Ambulatory Visit: Payer: Self-pay

## 2023-03-12 ENCOUNTER — Other Ambulatory Visit (HOSPITAL_COMMUNITY): Payer: Self-pay

## 2023-03-12 VITALS — BP 135/87 | HR 82 | Temp 98.2°F | Resp 16 | Wt 184.6 lb

## 2023-03-12 DIAGNOSIS — Z113 Encounter for screening for infections with a predominantly sexual mode of transmission: Secondary | ICD-10-CM | POA: Diagnosis present

## 2023-03-12 DIAGNOSIS — I10 Essential (primary) hypertension: Secondary | ICD-10-CM

## 2023-03-12 DIAGNOSIS — B20 Human immunodeficiency virus [HIV] disease: Secondary | ICD-10-CM | POA: Insufficient documentation

## 2023-03-12 MED ORDER — BIKTARVY 50-200-25 MG PO TABS
ORAL_TABLET | ORAL | 3 refills | Status: DC
Start: 2023-03-12 — End: 2024-02-08

## 2023-03-12 NOTE — Assessment & Plan Note (Signed)
He continues to do well on biktarvy.  Discussed Cabenuva and Dovato but doing well now so no changes indicated.  Can consider for ease of administration if desired.   Otherwise follow up in 11 months

## 2023-03-12 NOTE — Assessment & Plan Note (Signed)
BP wnl.   

## 2023-03-12 NOTE — Assessment & Plan Note (Signed)
Screened negative.  Discussed anal pap and screened today

## 2023-03-12 NOTE — Progress Notes (Signed)
Subjective:    Patient ID: Chase Rollins, male    DOB: 1980-12-21, 42 y.o.   MRN: [id number]  HPI Chase Rollins is here for follow up of HIV He continues on [place] with no missed doses.  No new issues.  No issues getting or taking Biktarvy.     Review of Systems  Constitutional:  Negative for fatigue.  Gastrointestinal:  Negative for diarrhea and nausea.  Skin:  Negative for rash.       Objective:   Physical Exam Eyes:     General: No scleral icterus. Pulmonary:     Effort: Pulmonary effort is normal.  Neurological:     Mental Status: He is alert.   SH: no tobacco        Assessment & Plan:

## 2023-03-13 ENCOUNTER — Other Ambulatory Visit: Payer: Self-pay | Admitting: Internal Medicine

## 2023-03-13 DIAGNOSIS — B20 Human immunodeficiency virus [HIV] disease: Secondary | ICD-10-CM

## 2023-03-17 LAB — CYTOLOGY - PAP

## 2023-03-23 ENCOUNTER — Other Ambulatory Visit: Payer: Self-pay | Admitting: Internal Medicine

## 2023-03-23 ENCOUNTER — Telehealth: Payer: Self-pay

## 2023-03-23 DIAGNOSIS — R85612 Low grade squamous intraepithelial lesion on cytologic smear of anus (LGSIL): Secondary | ICD-10-CM

## 2023-03-23 NOTE — Telephone Encounter (Signed)
-----   Message from Belia Heman Comer sent at 03/23/2023  1:25 PM EST ----- Can you let him know I have referred him to surgery for a positive anal pap screen? Thanks!

## 2023-03-25 ENCOUNTER — Other Ambulatory Visit: Payer: Self-pay | Admitting: Student

## 2023-03-26 NOTE — Telephone Encounter (Signed)
Called patient to review results and discuss referral, no answer and unable to leave message.   Sandie Ano, RN

## 2023-03-29 ENCOUNTER — Other Ambulatory Visit: Payer: Self-pay | Admitting: Student

## 2023-03-29 NOTE — Telephone Encounter (Signed)
Patient aware of results and referral. Also informed patient a link has been sent to him through my chart for more information.    Chase Rollins, CMA

## 2023-03-29 NOTE — Telephone Encounter (Signed)
Left voicemail asking patient to return my call.   Billey Wojciak P Farrin Shadle, CMA  

## 2023-04-24 ENCOUNTER — Other Ambulatory Visit: Payer: Self-pay | Admitting: Student

## 2023-05-03 ENCOUNTER — Ambulatory Visit: Payer: Self-pay | Admitting: General Surgery

## 2023-05-03 NOTE — H&P (Signed)
REFERRING PHYSICIAN:  Gardiner Barefoot, MD  PROVIDER:  Elenora Gamma, MD  MRN: [id number] DOB: Oct 20, 1980 DATE OF ENCOUNTER: 05/03/2023  Subjective   Chief Complaint: New Consultation ( pap smear of anus,) Patient with recent anal Pap which showed LSIL.  HIV levels are ND.  CD4 722 He denies noticing any external lesions  History of Present Illness: Chase Rollins is a 42 y.o. male who is seen today as an office consultation at the request of Dr. Luciana Axe for evaluation of New Consultation ( pap smear of anus,) .     Review of Systems: A complete review of systems was obtained from the patient.  I have reviewed this information and discussed as appropriate with the patient.  See HPI as well for other ROS.   Medical History: Past Medical History:  Diagnosis Date   Chronic kidney disease    Diabetes mellitus without complication (CMS/HHS-HCC)    HIV -AIDS with opportunistic infection, Symptomatic (CMS/HHS-HCC)    Hypertension     Patient Active Problem List  Diagnosis   Anemia   Hyperlipidemia   Noncompliance   Routine screening for STI (sexually transmitted infection)   Severe nonproliferative diabetic retinopathy of right eye, without macular edema, associated with type 1 diabetes mellitus (CMS/HHS-HCC)   T1DM (type 1 diabetes mellitus) (CMS/HHS-HCC)   Type 1 diabetes mellitus with stage 5 chronic kidney disease not on chronic dialysis (CMS/HHS-HCC)   Status post simultaneous kidney and pancreas transplant (CMS/HHS-HCC)   Immunosuppression (CMS/HHS-HCC)   Prophylactic antibiotic   Steroid-induced hyperglycemia   HCV NAT + donor   Anemia requiring transfusions   Abdominal hematoma    Past Surgical History:  Procedure Laterality Date   TRANSPLANT KIDNEY Left 03/04/2021   Procedure: RENAL ALLOTRANSPLANTATION, IMPLANTATION OF GRAFT; WITHOUT RECIPIENT NEPHRECTOMY;  Surgeon: Denzil Hughes, MD;  Location: [facility];  Service: General Surgery;   Laterality: Left;   TRANSPLANT PANCREAS N/A 03/04/2021   Procedure: TRANSPLANTATION OF PANCREATIC ALLOGRAFT;  Surgeon: Denzil Hughes, MD;  Location: [facility] OR;  Service: General Surgery;  Laterality: N/A;   EXPLORATION ABDOMEN FOR POSTOPERATIVE HEMORRHAGE/THROMBOSIS/INFECTION N/A 03/07/2021   Procedure: EXPLORATION FOR POSTOPERATIVE HEMORRHAGE, THROMBOSIS OR INFECTION; ABDOMEN,wash out;  Surgeon: Denzil Hughes, MD;  Location: [facility];  Service: General Surgery;  Laterality: N/A;  Abdominal washout for bleeding     No Known Allergies  Current Outpatient Medications on File Prior to Visit  Medication Sig Dispense Refill   acetaminophen (TYLENOL) 325 MG tablet Take 2 tablets (650 mg total) by mouth every 6 (six) hours as needed for Pain 100 tablet 0   amLODIPine (NORVASC) 10 MG tablet TAKE 1 TABLET BY MOUTH EVERY DAY 90 tablet 3   aspirin 81 MG EC tablet Take 1 tablet (81 mg total) by mouth once daily 120 tablet 2   atorvastatin (LIPITOR) 10 MG tablet Take 10 mg by mouth once daily     bictegravir-emtricitabine-tenofovir alafenamide (BIKTARVY) 50-200-25 mg tablet Take 1 tablet by mouth once daily 30 tablet 0   carvediloL (COREG) 12.5 MG tablet Take 12.5 mg by mouth 2 (two) times daily with meals     mycophenolate (CELLCEPT) 250 mg capsule TAKE 2 CAPSULES (500 MG TOTAL) BY MOUTH EVERY 12 (TWELVE) HOURS FOR 360 DAYS 360 capsule 3   predniSONE (DELTASONE) 5 MG tablet TAKE 1 TABLET BY MOUTH EVERY DAY 30 tablet 11   tacrolimus (PROGRAF) 1 MG capsule TAKE 5 CAPSULES (5 MG TOTAL) BY MOUTH EVERY 12 (TWELVE) HOURS  900 capsule 3   [DISCONTINUED] tamsulosin (FLOMAX) 0.4 mg capsule Take 1 capsule (0.4 mg total) by mouth once daily. Take 30 minutes after same meal each day. 30 capsule 0   No current facility-administered medications on file prior to visit.    Family History  Problem Relation Age of Onset   High blood pressure (Hypertension) Mother    High blood pressure  (Hypertension) Father    High blood pressure (Hypertension) Sister      Social History   Tobacco Use  Smoking Status Never  Smokeless Tobacco Never     Social History   Socioeconomic History   Marital status: Married  Tobacco Use   Smoking status: Never   Smokeless tobacco: Never  Substance and Sexual Activity   Alcohol use: Yes    Comment: rarely- 2-3 drinks monthy   Drug use: Not Currently    Comment: Prescribed oxycodone- as needed   Social Drivers of Health   Transportation Needs: No Transportation Needs (03/07/2021)   PRAPARE - Administrator, Civil Service (Medical): No    Lack of Transportation (Non-Medical): No    Objective:    Vitals:   05/03/23 1136  BP: (!) 152/81  Pulse: 106  Temp: 36.7 C (98 F)  SpO2: 97%  Weight: 81.2 kg (179 lb)  Height: 180.3 cm (5\' 11" )     Exam Gen: NAD Abd: soft    Labs, Imaging and Diagnostic Testing: See HPI  Assessment and Plan:  Diagnoses and all orders for this visit:  LGSIL Pap smear of anus     42 year old male with HIV and 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  I have recommended high-resolution anoscopy with possible biopsy or ablation.  We discussed risk of pain bleeding and recurrence.  All questions were answered.  He would like to proceed.  Vanita Panda, MD Colon and Rectal Surgery [facility]

## 2023-05-08 ENCOUNTER — Other Ambulatory Visit: Payer: Self-pay | Admitting: Student

## 2023-05-13 ENCOUNTER — Other Ambulatory Visit: Payer: Self-pay | Admitting: Student

## 2023-05-21 ENCOUNTER — Other Ambulatory Visit: Payer: Self-pay | Admitting: Cardiology

## 2023-05-21 NOTE — Telephone Encounter (Signed)
 Prescription sent to pharmacy.

## 2023-06-29 ENCOUNTER — Encounter (HOSPITAL_BASED_OUTPATIENT_CLINIC_OR_DEPARTMENT_OTHER): Payer: Self-pay | Admitting: General Surgery

## 2023-06-29 NOTE — Progress Notes (Signed)
Chart reviewed with anesthesia, Dr Richardson Landry MDA, due to patient complex medical history.  History CKD 5 / IDDM1/ not on dialysis s/p combined kidney & pancreas transplants 10/ 2022 @ [facility];  now not on insulin , only CKD, HIV prior to transplant,  chronic C due to donor positive HVC NAT and HTN .  Scheduled for anoscopy w/ possible biopsy or ablation with Dr Maisie Fus @ [facility].  Dr Richardson Landry MDA stated although patient would be ASA III +  and the anesthesiologist working the day of surgery , and Dr Maisie Fus will only take 15 minutes or less to do surgery,  stated okay to proceed with surgery @ [facility].

## 2023-06-30 ENCOUNTER — Other Ambulatory Visit: Payer: Self-pay

## 2023-06-30 ENCOUNTER — Encounter (HOSPITAL_BASED_OUTPATIENT_CLINIC_OR_DEPARTMENT_OTHER): Payer: Self-pay | Admitting: General Surgery

## 2023-06-30 NOTE — Progress Notes (Signed)
Spoke w/ via phone for pre-op interview---[facility] needs dos----ISTAT, EKG         Lab results------none COVID test -----patient states asymptomatic no test needed Arrive at -------0700 on Friday, 07/02/2023 NPO after MN NO Solid Food.  Clear liquids from MN until---0600 Med rec completed Medications to take morning of surgery -----Biktarvy, Coreg, Cellcept, Prograf Diabetic medication -----none Patient instructed no nail polish to be worn day of surgery Patient instructed to bring photo id and insurance card day of surgery Patient aware to have Driver (ride ) / caregiver    for 24 hours after surgery - husband, Barbara Cower Patient Special Instructions -----No alcohol for 24 hours prior to surgery. Pre-Op special Instructions -----No NS or BP in Left arm. Patient verbalized understanding of instructions that were given at this phone interview. Patient denies chest pain, sob, fever, cough at the interview.   See Progress note dated 06/29/23 from Harless Litten, RN.

## 2023-07-02 ENCOUNTER — Ambulatory Visit (HOSPITAL_BASED_OUTPATIENT_CLINIC_OR_DEPARTMENT_OTHER): Payer: Self-pay | Admitting: Certified Registered Nurse Anesthetist

## 2023-07-02 ENCOUNTER — Ambulatory Visit (HOSPITAL_BASED_OUTPATIENT_CLINIC_OR_DEPARTMENT_OTHER)
Admission: RE | Admit: 2023-07-02 | Discharge: 2023-07-02 | Disposition: A | Discharge status: Home / Self Care | Payer: BC Managed Care – PPO | Attending: General Surgery | Admitting: General Surgery

## 2023-07-02 ENCOUNTER — Other Ambulatory Visit: Payer: Self-pay

## 2023-07-02 ENCOUNTER — Encounter (HOSPITAL_BASED_OUTPATIENT_CLINIC_OR_DEPARTMENT_OTHER): Payer: Self-pay | Admitting: General Surgery

## 2023-07-02 ENCOUNTER — Encounter (HOSPITAL_BASED_OUTPATIENT_CLINIC_OR_DEPARTMENT_OTHER)
Admission: RE | Disposition: A | Discharge status: Home / Self Care | Payer: Self-pay | Source: Home / Self Care | Attending: General Surgery

## 2023-07-02 DIAGNOSIS — Z9483 Pancreas transplant status: Secondary | ICD-10-CM | POA: Diagnosis not present

## 2023-07-02 DIAGNOSIS — Z21 Asymptomatic human immunodeficiency virus [HIV] infection status: Secondary | ICD-10-CM | POA: Diagnosis not present

## 2023-07-02 DIAGNOSIS — E1022 Type 1 diabetes mellitus with diabetic chronic kidney disease: Secondary | ICD-10-CM | POA: Insufficient documentation

## 2023-07-02 DIAGNOSIS — D013 Carcinoma in situ of anus and anal canal: Secondary | ICD-10-CM | POA: Diagnosis not present

## 2023-07-02 DIAGNOSIS — I129 Hypertensive chronic kidney disease with stage 1 through stage 4 chronic kidney disease, or unspecified chronic kidney disease: Secondary | ICD-10-CM | POA: Insufficient documentation

## 2023-07-02 DIAGNOSIS — I120 Hypertensive chronic kidney disease with stage 5 chronic kidney disease or end stage renal disease: Secondary | ICD-10-CM | POA: Diagnosis not present

## 2023-07-02 DIAGNOSIS — Z940 Kidney transplant status: Secondary | ICD-10-CM | POA: Diagnosis not present

## 2023-07-02 DIAGNOSIS — N189 Chronic kidney disease, unspecified: Secondary | ICD-10-CM | POA: Diagnosis not present

## 2023-07-02 DIAGNOSIS — Z01818 Encounter for other preprocedural examination: Secondary | ICD-10-CM

## 2023-07-02 DIAGNOSIS — A630 Anogenital (venereal) warts: Secondary | ICD-10-CM | POA: Diagnosis present

## 2023-07-02 DIAGNOSIS — B1920 Unspecified viral hepatitis C without hepatic coma: Secondary | ICD-10-CM | POA: Insufficient documentation

## 2023-07-02 DIAGNOSIS — D631 Anemia in chronic kidney disease: Secondary | ICD-10-CM | POA: Diagnosis not present

## 2023-07-02 HISTORY — DX: Encounter for examination for normal comparison and control in clinical research program: Z00.6

## 2023-07-02 HISTORY — DX: Type 1 diabetes mellitus without complications: E10.9

## 2023-07-02 HISTORY — DX: Immunodeficiency, unspecified: D84.9

## 2023-07-02 HISTORY — DX: Type 2 diabetes mellitus with severe nonproliferative diabetic retinopathy without macular edema, bilateral: E11.3493

## 2023-07-02 HISTORY — DX: Low grade squamous intraepithelial lesion on cytologic smear of anus (LGSIL): R85.612

## 2023-07-02 HISTORY — DX: Chronic viral hepatitis C: B18.2

## 2023-07-02 HISTORY — PX: HIGH RESOLUTION ANOSCOPY: SHX6345

## 2023-07-02 LAB — POCT I-STAT, CHEM 8
BUN: 21 mg/dL — ABNORMAL HIGH (ref 6–20)
Calcium, Ion: 1.25 mmol/L (ref 1.15–1.40)
Chloride: 104 mmol/L (ref 98–111)
Creatinine, Ser: 1.7 mg/dL — ABNORMAL HIGH (ref 0.61–1.24)
Glucose, Bld: 88 mg/dL (ref 70–99)
HCT: 48 % (ref 39.0–52.0)
Hemoglobin: 16.3 g/dL (ref 13.0–17.0)
Potassium: 5.1 mmol/L (ref 3.5–5.1)
Sodium: 141 mmol/L (ref 135–145)
TCO2: 28 mmol/L (ref 22–32)

## 2023-07-02 SURGERY — ANOSCOPY, HIGH RESOLUTION
Anesthesia: Monitor Anesthesia Care

## 2023-07-02 MED ORDER — PROPOFOL 500 MG/50ML IV EMUL
INTRAVENOUS | Status: AC
  Filled 2023-07-02: qty 50

## 2023-07-02 MED ORDER — SODIUM CHLORIDE 0.9 % IV SOLN: INTRAVENOUS | Status: DC

## 2023-07-02 MED ORDER — TRAMADOL HCL 50 MG PO TABS: 50.0000 mg | ORAL_TABLET | Freq: Four times a day (QID) | ORAL | 0 refills | Status: DC | PRN

## 2023-07-02 MED ORDER — PROPOFOL 10 MG/ML IV BOLUS
INTRAVENOUS | Status: DC | PRN
  Administered 2023-07-02: 30 mg via INTRAVENOUS

## 2023-07-02 MED ORDER — MIDAZOLAM HCL 2 MG/2ML IJ SOLN: 0.5000 mg | Freq: Once | INTRAMUSCULAR | Status: DC | PRN

## 2023-07-02 MED ORDER — ACETIC ACID 5 % SOLN
Status: DC | PRN
  Administered 2023-07-02: 1 via TOPICAL

## 2023-07-02 MED ORDER — MIDAZOLAM HCL 2 MG/2ML IJ SOLN
INTRAMUSCULAR | Status: AC
  Filled 2023-07-02: qty 2

## 2023-07-02 MED ORDER — STERILE WATER FOR IRRIGATION IR SOLN
Status: DC | PRN
  Administered 2023-07-02: 1000 mL

## 2023-07-02 MED ORDER — MEPERIDINE HCL 25 MG/ML IJ SOLN: 6.2500 mg | INTRAMUSCULAR | Status: DC | PRN

## 2023-07-02 MED ORDER — OXYCODONE HCL 5 MG PO TABS: 5.0000 mg | ORAL_TABLET | Freq: Once | ORAL | Status: DC | PRN

## 2023-07-02 MED ORDER — KETAMINE HCL 50 MG/5ML IJ SOSY
PREFILLED_SYRINGE | INTRAMUSCULAR | Status: AC
  Filled 2023-07-02: qty 5

## 2023-07-02 MED ORDER — BUPIVACAINE-EPINEPHRINE 0.5% -1:200000 IJ SOLN
INTRAMUSCULAR | Status: DC | PRN
  Administered 2023-07-02: 30 mL

## 2023-07-02 MED ORDER — MIDAZOLAM HCL 2 MG/2ML IJ SOLN
INTRAMUSCULAR | Status: DC | PRN
  Administered 2023-07-02 (×2): 1 mg via INTRAVENOUS

## 2023-07-02 MED ORDER — ACETAMINOPHEN 500 MG PO TABS
ORAL_TABLET | ORAL | Status: AC
  Filled 2023-07-02: qty 2

## 2023-07-02 MED ORDER — PROPOFOL 500 MG/50ML IV EMUL
INTRAVENOUS | Status: DC | PRN
  Administered 2023-07-02: 200 ug/kg/min via INTRAVENOUS

## 2023-07-02 MED ORDER — ACETAMINOPHEN 500 MG PO TABS
1000.0000 mg | ORAL_TABLET | Freq: Once | ORAL | Status: AC
  Administered 2023-07-02: 1000 mg via ORAL

## 2023-07-02 MED ORDER — SODIUM CHLORIDE 0.9% FLUSH: 3.0000 mL | Freq: Two times a day (BID) | INTRAVENOUS | Status: DC

## 2023-07-02 MED ORDER — FENTANYL CITRATE (PF) 100 MCG/2ML IJ SOLN
INTRAMUSCULAR | Status: DC | PRN
  Administered 2023-07-02: 50 ug via INTRAVENOUS

## 2023-07-02 MED ORDER — FENTANYL CITRATE (PF) 100 MCG/2ML IJ SOLN
INTRAMUSCULAR | Status: AC
  Filled 2023-07-02: qty 2

## 2023-07-02 MED ORDER — KETAMINE HCL 10 MG/ML IJ SOLN
INTRAMUSCULAR | Status: DC | PRN
  Administered 2023-07-02: 50 mg via INTRAVENOUS

## 2023-07-02 MED ORDER — HYDROMORPHONE HCL 1 MG/ML IJ SOLN: 0.2500 mg | INTRAMUSCULAR | Status: DC | PRN

## 2023-07-02 MED ORDER — OXYCODONE HCL 5 MG/5ML PO SOLN: 5.0000 mg | Freq: Once | ORAL | Status: DC | PRN

## 2023-07-02 SURGICAL SUPPLY — 39 items
APPLICATOR COTTON TIP 6 STRL (MISCELLANEOUS) IMPLANT
APPLICATOR COTTON TIP 6IN STRL (MISCELLANEOUS)
BENZOIN TINCTURE PRP APPL 2/3 (GAUZE/BANDAGES/DRESSINGS) ×1 IMPLANT
BLADE EXTENDED COATED 6.5IN (ELECTRODE) IMPLANT
BLADE SURG 10 STRL SS (BLADE) ×1 IMPLANT
BRIEF MESH DISP LRG (UNDERPADS AND DIAPERS) ×1 IMPLANT
COVER BACK TABLE 60X90IN (DRAPES) ×1 IMPLANT
DRAPE HYSTEROSCOPY (MISCELLANEOUS) IMPLANT
DRAPE LAPAROTOMY 100X72 PEDS (DRAPES) ×1 IMPLANT
DRAPE SHEET LG 3/4 BI-LAMINATE (DRAPES) IMPLANT
DRAPE UTILITY XL STRL (DRAPES) ×1 IMPLANT
ELECT REM PT RETURN 9FT ADLT (ELECTROSURGICAL) ×1
ELECTRODE REM PT RTRN 9FT ADLT (ELECTROSURGICAL) ×1 IMPLANT
GAUZE 4X4 16PLY ~~LOC~~+RFID DBL (SPONGE) ×1 IMPLANT
GAUZE PAD ABD 8X10 STRL (GAUZE/BANDAGES/DRESSINGS) IMPLANT
GAUZE SPONGE 4X4 12PLY STRL (GAUZE/BANDAGES/DRESSINGS) IMPLANT
GAUZE SPONGE 4X4 12PLY STRL LF (GAUZE/BANDAGES/DRESSINGS) IMPLANT
GLOVE BIO SURGEON STRL SZ 6.5 (GLOVE) ×1 IMPLANT
GLOVE INDICATOR 6.5 STRL GRN (GLOVE) ×1 IMPLANT
KIT SIGMOIDOSCOPE (SET/KITS/TRAYS/PACK) IMPLANT
KIT TURNOVER CYSTO (KITS) ×1 IMPLANT
LEGGING LITHOTOMY PAIR STRL (DRAPES) IMPLANT
NDL HYPO 22X1.5 SAFETY MO (MISCELLANEOUS) ×1 IMPLANT
NEEDLE HYPO 22X1.5 SAFETY MO (MISCELLANEOUS) ×1
NS IRRIG 500ML POUR BTL (IV SOLUTION) ×1 IMPLANT
PACK BASIN DAY SURGERY FS (CUSTOM PROCEDURE TRAY) ×1 IMPLANT
PAD ARMBOARD 7.5X6 YLW CONV (MISCELLANEOUS) IMPLANT
PENCIL SMOKE EVACUATOR (MISCELLANEOUS) ×1 IMPLANT
SLEEVE SCD COMPRESS KNEE MED (STOCKING) ×1 IMPLANT
SPIKE FLUID TRANSFER (MISCELLANEOUS) ×1 IMPLANT
SPONGE SURGIFOAM ABS GEL 12-7 (HEMOSTASIS) IMPLANT
SUT CHROMIC 2 0 SH (SUTURE) IMPLANT
SUT CHROMIC 3 0 SH 27 (SUTURE) IMPLANT
SYR BULB IRRIG 60ML STRL (SYRINGE) ×1 IMPLANT
SYR CONTROL 10ML LL (SYRINGE) ×1 IMPLANT
TOWEL OR 17X24 6PK STRL BLUE (TOWEL DISPOSABLE) ×1 IMPLANT
TRAY DSU PREP LF (CUSTOM PROCEDURE TRAY) ×1 IMPLANT
TUBE CONNECTING 12X1/4 (SUCTIONS) ×1 IMPLANT
YANKAUER SUCT BULB TIP NO VENT (SUCTIONS) IMPLANT

## 2023-07-02 NOTE — H&P (Signed)
REFERRING PHYSICIAN:  Gardiner Barefoot, MD   PROVIDER:  Elenora Gamma, MD   MRN: [id number] DOB: [birth year] DATE OF ENCOUNTER: 05/03/2023   Subjective    Chief Complaint: New Consultation ( pap smear of anus,) Patient with recent anal Pap which showed LSIL.  HIV levels are ND.  CD4 722 He denies noticing any external lesions   History of Present Illness: Chase Rollins is a 43 y.o. male who is seen today as an office consultation at the request of Dr. Luciana Axe for evaluation of New Consultation ( pap smear of anus,) .       Review of Systems: A complete review of systems was obtained from the patient.  I have reviewed this information and discussed as appropriate with the patient.  See HPI as well for other ROS.     Medical History:     Past Medical History:  Diagnosis Date   Chronic kidney disease     Diabetes mellitus without complication (CMS/HHS-HCC)     HIV -AIDS with opportunistic infection, Symptomatic (CMS/HHS-HCC)     Hypertension           Patient Active Problem List  Diagnosis   Anemia   Hyperlipidemia   Noncompliance   Routine screening for STI (sexually transmitted infection)   Severe nonproliferative diabetic retinopathy of right eye, without macular edema, associated with type 1 diabetes mellitus (CMS/HHS-HCC)   T1DM (type 1 diabetes mellitus) (CMS/HHS-HCC)   Type 1 diabetes mellitus with stage 5 chronic kidney disease not on chronic dialysis (CMS/HHS-HCC)   Status post simultaneous kidney and pancreas transplant (CMS/HHS-HCC)   Immunosuppression (CMS/HHS-HCC)   Prophylactic antibiotic   Steroid-induced hyperglycemia   HCV NAT + donor   Anemia requiring transfusions   Abdominal hematoma           Past Surgical History:  Procedure Laterality Date   TRANSPLANT KIDNEY Left 03/04/2021    Procedure: RENAL ALLOTRANSPLANTATION, IMPLANTATION OF GRAFT; WITHOUT RECIPIENT NEPHRECTOMY;  Surgeon: Denzil Hughes, MD;  Location: [facility]  OR;  Service: General Surgery;  Laterality: Left;   TRANSPLANT PANCREAS N/A 03/04/2021    Procedure: TRANSPLANTATION OF PANCREATIC ALLOGRAFT;  Surgeon: Denzil Hughes, MD;  Location: [facility];  Service: General Surgery;  Laterality: N/A;   EXPLORATION ABDOMEN FOR POSTOPERATIVE HEMORRHAGE/THROMBOSIS/INFECTION N/A 03/07/2021    Procedure: EXPLORATION FOR POSTOPERATIVE HEMORRHAGE, THROMBOSIS OR INFECTION; ABDOMEN,wash out;  Surgeon: Denzil Hughes, MD;  Location: [facility];  Service: General Surgery;  Laterality: N/A;  Abdominal washout for bleeding      No Known Allergies         Current Outpatient Medications on File Prior to Visit  Medication Sig Dispense Refill   acetaminophen (TYLENOL) 325 MG tablet Take 2 tablets (650 mg total) by mouth every 6 (six) hours as needed for Pain 100 tablet 0   amLODIPine (NORVASC) 10 MG tablet TAKE 1 TABLET BY MOUTH EVERY DAY 90 tablet 3   aspirin 81 MG EC tablet Take 1 tablet (81 mg total) by mouth once daily 120 tablet 2   atorvastatin (LIPITOR) 10 MG tablet Take 10 mg by mouth once daily       bictegravir-emtricitabine-tenofovir alafenamide (BIKTARVY) 50-200-25 mg tablet Take 1 tablet by mouth once daily 30 tablet 0   carvediloL (COREG) 12.5 MG tablet Take 12.5 mg by mouth 2 (two) times daily with meals       mycophenolate (CELLCEPT) 250 mg capsule TAKE 2 CAPSULES (500 MG TOTAL) BY MOUTH EVERY 12 (TWELVE)  HOURS FOR 360 DAYS 360 capsule 3   predniSONE (DELTASONE) 5 MG tablet TAKE 1 TABLET BY MOUTH EVERY DAY 30 tablet 11   tacrolimus (PROGRAF) 1 MG capsule TAKE 5 CAPSULES (5 MG TOTAL) BY MOUTH EVERY 12 (TWELVE) HOURS 900 capsule 3   [DISCONTINUED] tamsulosin (FLOMAX) 0.4 mg capsule Take 1 capsule (0.4 mg total) by mouth once daily. Take 30 minutes after same meal each day. 30 capsule 0    No current facility-administered medications on file prior to visit.           Family History  Problem Relation Age of Onset   High blood  pressure (Hypertension) Mother     High blood pressure (Hypertension) Father     High blood pressure (Hypertension) Sister        Social History       Tobacco Use  Smoking Status Never  Smokeless Tobacco Never      Social History         Socioeconomic History   Marital status: Married  Tobacco Use   Smoking status: Never   Smokeless tobacco: Never  Substance and Sexual Activity   Alcohol use: Yes      Comment: rarely- 2-3 drinks monthy   Drug use: Not Currently      Comment: Prescribed oxycodone- as needed    Social Drivers of Health        Transportation Needs: No Transportation Needs (03/07/2021)    PRAPARE - Therapist, art (Medical): No     Lack of Transportation (Non-Medical): No      Objective:     Today's Vitals   06/30/23 1045 07/02/23 0804  BP:  (!) 143/94  Pulse:  77  Resp:  16  Temp:  97.6 F (36.4 C)  TempSrc:  Oral  SpO2:  99%  Weight: 81.6 kg 82.3 kg  Height: 5\' 11"  (1.803 m) 5\' 11"  (1.803 m)  PainSc:  0-No pain   Body mass index is 25.3 kg/m.  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  I have recommended high-resolution anoscopy with possible biopsy or ablation.  We discussed risk of pain bleeding and recurrence.  All questions were answered.  He would like to proceed.   Vanita Panda, MD Colon and Rectal Surgery [facility]

## 2023-07-02 NOTE — Transfer of Care (Signed)
Immediate Anesthesia Transfer of Care Note  Patient: Chase Rollins  Procedure(s) Performed: Procedure(s) (LRB): HIGH RESOLUTION ANOSCOPY,  BIOPSY and ABLATION (N/A)  Patient Location: PACU  Anesthesia Type: MAC  Level of Consciousness: awake, alert , oriented and patient cooperative  Airway & Oxygen Therapy: Patient Spontanous Breathing Room Air  Post-op Assessment: Report given to PACU RN and Post -op Vital signs reviewed and stable  Post vital signs: Reviewed and stable  Complications: No apparent anesthesia complications Last Vitals:  Vitals Value Taken Time  BP 134/84 07/02/23 1055  Temp    Pulse 73 07/02/23 1057  Resp 15 07/02/23 1056  SpO2 99 % 07/02/23 1057  Vitals shown include unfiled device data.  Last Pain:  Vitals:   07/02/23 0804  TempSrc: Oral  PainSc: 0-No pain      Patients Stated Pain Goal: 5 (07/02/23 0804)  Complications: No notable events documented.

## 2023-07-02 NOTE — Anesthesia Postprocedure Evaluation (Signed)
Anesthesia Post Note  Patient: Haven Coles III  Procedure(s) Performed: HIGH RESOLUTION ANOSCOPY,  BIOPSY and ABLATION     Patient location during evaluation: Phase II Anesthesia Type: MAC Level of consciousness: awake and alert, oriented and patient cooperative Pain management: pain level controlled Vital Signs Assessment: post-procedure vital signs reviewed and stable Respiratory status: nonlabored ventilation, respiratory function stable and patient connected to nasal cannula oxygen Cardiovascular status: blood pressure returned to baseline and stable Postop Assessment: no apparent nausea or vomiting, adequate PO intake and able to ambulate Anesthetic complications: no   No notable events documented.  Last Vitals:  Vitals:   07/02/23 1130 07/02/23 1152  BP: (!) 145/87 134/84  Pulse: 72 74  Resp: 11 16  Temp: (!) 36.2 C (!) 36.3 C  SpO2: 98% 100%    Last Pain:  Vitals:   07/02/23 1130  TempSrc:   PainSc: 0-No pain                 Tane Biegler,E. Mya Suell

## 2023-07-02 NOTE — Anesthesia Preprocedure Evaluation (Addendum)
Anesthesia Evaluation  Patient identified by MRN, date of birth, ID band Patient awake    Reviewed: Allergy & Precautions, NPO status , Patient's Chart, lab work & pertinent test results, reviewed documented beta blocker date and time   History of Anesthesia Complications Negative for: history of anesthetic complications  Airway Mallampati: I  TM Distance: >3 FB Neck ROM: Full    Dental  (+) Dental Advisory Given   Pulmonary neg pulmonary ROS   breath sounds clear to auscultation       Cardiovascular hypertension, Pt. on medications and Pt. on home beta blockers (-) angina  Rhythm:Regular Rate:Normal  '20 ECHO: EF 60-65%.  1. The LV cavity size was normal. There is mildly increased left ventricular wall thickness. Left ventricular diastolic parameters were normal.   2. The right ventricle has normal systolic function. The cavity was normal. There is no increase in right ventricular wall thickness.  3. Mild MR     Neuro/Psych negative neurological ROS     GI/Hepatic negative GI ROS,,,(+) Hepatitis -, C  Endo/Other  diabetes (glu 88)  S/p pancreas transplant  Renal/GU Renal InsufficiencyRenal diseaseS/p renal transplant K+ 5.1, creat 1.7     Musculoskeletal   Abdominal   Peds  Hematology  (+) HIV  Anesthesia Other Findings   Reproductive/Obstetrics                             Anesthesia Physical Anesthesia Plan  ASA: 3  Anesthesia Plan: MAC   Post-op Pain Management: Tylenol PO (pre-op)* and Minimal or no pain anticipated   Induction:   PONV Risk Score and Plan: 1  Airway Management Planned: Natural Airway and Simple Face Mask  Additional Equipment: None  Intra-op Plan:   Post-operative Plan:   Informed Consent: I have reviewed the patients History and Physical, chart, labs and discussed the procedure including the risks, benefits and alternatives for the proposed  anesthesia with the patient or authorized representative who has indicated his/her understanding and acceptance.     Dental advisory given  Plan Discussed with: CRNA and Surgeon  Anesthesia Plan Comments:         Anesthesia Quick Evaluation

## 2023-07-02 NOTE — Discharge Instructions (Addendum)
DO NOT TAKE TYLENOL UNTIL AFTER 2:15pm today.  Beginning the day after surgery:  You may sit in a tub of warm water 2-3 times a day to relieve discomfort.  Eat a regular diet high in fiber.  Avoid foods that give you constipation or diarrhea.  Avoid foods that are difficult to digest, such as seeds, nuts, corn or popcorn.  Do not go any longer than 2 days without a bowel movement.  You may take a dose of Milk of Magnesia if you become constipated.    Drink 6-8 glasses of water daily.  Walking is encouraged.  Avoid strenuous activity and heavy lifting for one month after surgery.    Call the office if you have any questions or concerns.  Call immediately if you develop:  Excessive rectal bleeding (more than a cup or passing large clots) Increased discomfort Fever greater than 100 F Difficulty urinating  Post Anesthesia Home Care Instructions  Activity: Get plenty of rest for the remainder of the day. A responsible adult should stay with you for 24 hours following the procedure.  For the next 24 hours, DO NOT: -Drive a car -Advertising copywriter -Drink alcoholic beverages -Take any medication unless instructed by your physician -Make any legal decisions or sign important papers.  Meals: Start with liquid foods such as gelatin or soup. Progress to regular foods as tolerated. Avoid greasy, spicy, heavy foods. If nausea and/or vomiting occur, drink only clear liquids until the nausea and/or vomiting subsides. Call your physician if vomiting continues.  Special Instructions/Symptoms: Your throat may feel dry or sore from the anesthesia or the breathing tube placed in your throat during surgery. If this causes discomfort, gargle with warm salt water. The discomfort should disappear within 24 hours.  Call your surgeon if you experience:   1.  Fever over 101.0. 2.  Inability to urinate. 3.  Nausea and/or vomiting. 4.  Extreme swelling or bruising at the surgical site. 5.  Continued  bleeding from the incision. 6.  Increased pain, redness or drainage from the incision. 7.  Problems related to your pain medication. 8. Any change in color, movement and/or sensation 9. Any problems and/or concerns

## 2023-07-02 NOTE — Op Note (Signed)
07/02/2023  10:45 AM  PATIENT:  Chase Rollins  43 y.o. male  Patient Care Team: Rudd, Bertram Millard, MD as PCP - General (Family Medicine) Baldo Daub, MD as PCP - Cardiology (Cardiology) Baldo Daub, MD as Consulting Physician (Cardiology) Comer, Belia Heman, MD as Consulting Physician (Infectious Diseases)  PRE-OPERATIVE DIAGNOSIS:  HRA  POST-OPERATIVE DIAGNOSIS:  ANAL CONDYLOMA  PROCEDURE:  HIGH RESOLUTION ANOSCOPY, POSSIBLE BIOPSY OR ABLATION   Surgeon(s): Romie Levee, MD  ASSISTANT: none   ANESTHESIA:   local and MAC  EBL: 5ml  Total I/O In: 500 [I.V.:500] Out: 0   SPECIMEN:  Source of Specimen:  R anterior and R posterior anal canal, L lateral anal canal  DISPOSITION OF SPECIMEN:  PATHOLOGY  COUNTS:  YES  PLAN OF CARE: Discharge to home after PACU  PATIENT DISPOSITION:  PACU - hemodynamically stable.  INDICATION: 43 y.o. M with LSIL on anal pap test  OR FINDINGS: anal condyloma, anal lesion  DESCRIPTION: The patient was identified in the preoperative holding area and taken to the OR where they were laid supine on the operating room table in lithotomy position. MAC anesthesia was smoothly induced.  The patient was then prepped and draped in the usual sterile fashion. A surgical timeout was performed indicating the correct patient, procedure, positioning and preoperative antibioitics. SCDs were noted to be in place and functioning prior to the operation.   After this was completed, a sponge was soaked in 5% acetic acid was placed over the perianal region. This was allowed to soak for 2 minutes. The sponge was removed and the perianal region was evaluated with a colposcope.  There were no external lesions.  The internal anal canal was evaluated via anoscopy with a Hill-Ferguson anoscope.  There were 2 areas of condyloma at the dentate line in the L lateral anal canal.  These were removed sharply and closed with 3-0 Chromic sutures.  There were 2 lesions at  the dentate line in the RA and RP anal canal.  Representative biopsies were taken and the areas were then ablated.  After this was completed, hemostasis was achieved with electrocautery and all of the biopsy sites were closed using a 3-0 chromic suture.  Additional Marcaine was placed for post op pain control.  A sterile dressing was applied over this. The patient was then awakened from anesthesia and sent to the postanesthesia care unit in stable condition. All counts were correct operating room staff.   Vanita Panda, MD  Colorectal and General Surgery [facility] Surgery

## 2023-07-03 ENCOUNTER — Encounter (HOSPITAL_BASED_OUTPATIENT_CLINIC_OR_DEPARTMENT_OTHER): Payer: Self-pay | Admitting: General Surgery

## 2023-07-06 LAB — SURGICAL PATHOLOGY

## 2023-07-22 ENCOUNTER — Telehealth: Admitting: Family Medicine

## 2023-07-22 DIAGNOSIS — U071 COVID-19: Secondary | ICD-10-CM

## 2023-07-22 DIAGNOSIS — D849 Immunodeficiency, unspecified: Secondary | ICD-10-CM | POA: Diagnosis not present

## 2023-07-22 MED ORDER — MOLNUPIRAVIR EUA 200MG CAPSULE: 4.0000 | ORAL_CAPSULE | Freq: Two times a day (BID) | ORAL | 0 refills | Status: AC

## 2023-07-22 MED ORDER — BENZONATATE 100 MG PO CAPS: 100.0000 mg | ORAL_CAPSULE | Freq: Three times a day (TID) | ORAL | 0 refills | Status: AC | PRN

## 2023-07-22 NOTE — Patient Instructions (Addendum)
 Advised of CDC guidelines for self isolation/ ending isolation.  Advised of safe practice guidelines. Symptom Tier reviewed.  Encouraged to monitor for any worsening symptoms; watch for increased shortness of breath, weakness, and signs of dehydration. Advised when to seek emergency care.  Instructed to rest and hydrate well.  Advised to leave the house during recommended isolation period, only if it is necessary to seek medical care    Schedule appt with Dr. Veto Kemps for annual physical as not seen since 2023.

## 2023-07-22 NOTE — Progress Notes (Signed)
 MyChart Video Visit Virtual Visit via Video Note   This visit type was conducted w/patient consent. This format is felt to be most appropriate for this patient at this time. Physical exam was limited by quality of the video and audio technology used for the visit. CMA was able to get the patient set up on a video visit.  Patient location: Home. Patient and provider in visit Provider location: Office  I discussed the limitations of evaluation and management by telemedicine and the availability of in person appointments. The patient expressed understanding and agreed to proceed.  Visit Date: 07/22/2023  Today's healthcare provider: Angelena Sole, MD     Subjective:    Patient ID: Chase Rollins, male    DOB: 08/10/1980, 43 y.o.   MRN: [id number]  Chief Complaint  Patient presents with   Covid Positive    Tested positive on yesterday, sx started Tuesday around 3 or 4 in the morning Has been taking Tylenol as needed    Cough    Non productive cough   Headache   Generalized Body Aches   Chills   Nasal Congestion    HPI Co-ordinator from xplant said to call pcp for molnupivir.   Husband sick last wk Pt is a [facility]plant pt, on meds and Biktarvy as well.  Sick for 3 days.  Home test + for covid yesterday.  No sob.  Past Medical History:  Diagnosis Date   Anemia associated with chronic renal failure    Chronic hepatitis C (HCC)    transplant from HCV NAT positive donor, per patient Hep C was treated and cleared   Chronic kidney disease    Stage IV,  [facility] follows , no problems since transplant per pt   HIV (human immunodeficiency virus infection) (HCC)    ID-- dr comer   HTN (hypertension)    Follows w/ PCP, Dr. Veto Kemps and nephrologist @ [facility].   Immunosuppression (HCC)    LGSIL Pap smear of anus    Non-proliferative diabetic retinopathy, severe, both eyes (HCC)    Pneumonia 2017   Research subject    Status post simultaneous kidney and pancreas transplant (HCC)  03/04/2021   @[facility]    Type 1 diabetes mellitus (HCC)    Type I - per Endocrinologist , no meds, pt had pancreas transplant    Past Surgical History:  Procedure Laterality Date   ABDOMINAL EXPLORATION SURGERY  03/07/2021   @ [facility];   ABDOMINAL WASH OUT FOR POSTOPERATIVE BLEEDING   AV FISTULA PLACEMENT Left 07/12/2020   Procedure: LEFT ARM ARTERIOVENOUS (AV) BRACHIOCEPHALIC FISTULA CREATION;  Surgeon: Leonie Douglas, MD;  Location: [facility];  Service: Vascular;  Laterality: Left;   COMBINED KIDNEY-PANCREAS TRANSPLANT Bilateral 03/04/2021   @ [facility]   HIGH RESOLUTION ANOSCOPY N/A 07/02/2023   Procedure: HIGH RESOLUTION ANOSCOPY,  BIOPSY and ABLATION;  Surgeon: Romie Levee, MD;  Location: [facility];  Service: General;  Laterality: N/A;   WISDOM TOOTH EXTRACTION      Outpatient Medications Prior to Visit  Medication Sig Dispense Refill   acetaminophen (TYLENOL) 500 MG tablet Take 1,000 mg by mouth every 6 (six) hours as needed.     aspirin EC 81 MG tablet Take by mouth.     bictegravir-emtricitabine-tenofovir AF (BIKTARVY) 50-200-25 MG TABS tablet TAKE 1 TABLET BY MOUTH 1 TIME A DAY. 90 tablet 3   carvedilol (COREG) 12.5 MG tablet Take 12.5 mg by mouth 2 (two) times daily with a meal.  mycophenolate (CELLCEPT) 250 MG capsule TAKE 2 CAPSULES (500 MG TOTAL) BY MOUTH EVERY 12 (TWELVE) HOURS FOR 360 DAYS     predniSONE (DELTASONE) 5 MG tablet Take 5 mg by mouth daily.     tacrolimus (PROGRAF) 1 MG capsule SMARTSIG:5 Capsule(s) By Mouth Every 12 Hours     atorvastatin (LIPITOR) 10 MG tablet TAKE 1 TABLET BY MOUTH EVERY DAY (Patient not taking: Reported on 06/30/2023) 15 tablet 0   traMADol (ULTRAM) 50 MG tablet Take 1-2 tablets (50-100 mg total) by mouth every 6 (six) hours as needed. 15 tablet 0   No facility-administered medications prior to visit.    No Known Allergies      Objective:     Physical Exam  Vitals and nursing note reviewed.  Constitutional:       General:  is not in acute distress.    Appearance: Normal appearance.  HENT:     Head: Normocephalic.  Pulmonary:     Effort: No respiratory distress.  Skin:    General: Skin is dry.     Coloration: Skin is not pale.  Neurological:     Mental Status: Pt is alert and oriented to person, place, and time.  Psychiatric:        Mood and Affect: Mood normal.   There were no vitals taken for this visit.  Wt Readings from Last 3 Encounters:  07/02/23 181 lb 6.4 oz (82.3 kg)  03/12/23 184 lb 9.6 oz (83.7 kg)  03/13/22 180 lb 6.4 oz (81.8 kg)       Assessment & Plan:   Problem List Items Addressed This Visit     Immunosuppression (HCC)   Other Visit Diagnoses       COVID-19    -  Primary   Relevant Medications   molnupiravir EUA (LAGEVRIO) 200 mg CAPS capsule     Covid 19 in an immunosuppressed person.  Molnupiravir.  Rest, fluids.  Isolate.  Off work 5 days and then mask for 5.  Worse, ER  Meds ordered this encounter  Medications   benzonatate (TESSALON PERLES) 100 MG capsule    Sig: Take 1 capsule (100 mg total) by mouth 3 (three) times daily as needed.    Dispense:  20 capsule    Refill:  0   molnupiravir EUA (LAGEVRIO) 200 mg CAPS capsule    Sig: Take 4 capsules (800 mg total) by mouth 2 (two) times daily for 5 days.    Dispense:  40 capsule    Refill:  0    I discussed the assessment and treatment plan with the patient. The patient was provided an opportunity to ask questions and all were answered. The patient agreed with the plan and demonstrated an understanding of the instructions.   The patient was advised to call back or seek an in-person evaluation if the symptoms worsen or if the condition fails to improve as anticipated.  Return if symptoms worsen or fail to improve.  Angelena Sole, MD [facility] [phone] (phone) [phone] (fax)  [facility]

## 2023-09-27 NOTE — Progress Notes (Signed)
 The ASCVD Risk score (Arnett DK, et al., 2019) failed to calculate for the following reasons:   The valid total cholesterol range is 130 to 320 mg/dL  Arlon Bergamo, BSN, RN

## 2024-01-18 ENCOUNTER — Other Ambulatory Visit: Payer: BC Managed Care – PPO

## 2024-01-25 ENCOUNTER — Other Ambulatory Visit: Payer: Self-pay

## 2024-01-25 DIAGNOSIS — B20 Human immunodeficiency virus [HIV] disease: Secondary | ICD-10-CM

## 2024-01-25 DIAGNOSIS — Z113 Encounter for screening for infections with a predominantly sexual mode of transmission: Secondary | ICD-10-CM

## 2024-02-01 ENCOUNTER — Ambulatory Visit: Admitting: Infectious Disease

## 2024-02-01 ENCOUNTER — Ambulatory Visit: Payer: Self-pay | Admitting: Internal Medicine

## 2024-02-03 ENCOUNTER — Other Ambulatory Visit (HOSPITAL_COMMUNITY)
Admission: RE | Admit: 2024-02-03 | Discharge: 2024-02-03 | Disposition: A | Discharge status: Home / Self Care | Source: Ambulatory Visit | Attending: Infectious Disease | Admitting: Infectious Disease

## 2024-02-03 ENCOUNTER — Other Ambulatory Visit

## 2024-02-03 ENCOUNTER — Other Ambulatory Visit: Payer: Self-pay

## 2024-02-03 DIAGNOSIS — B20 Human immunodeficiency virus [HIV] disease: Secondary | ICD-10-CM

## 2024-02-03 DIAGNOSIS — Z113 Encounter for screening for infections with a predominantly sexual mode of transmission: Secondary | ICD-10-CM | POA: Insufficient documentation

## 2024-02-04 LAB — T-HELPER CELL (CD4) - (RCID CLINIC ONLY)
CD4 % Helper T Cell: 49 % (ref 33–65)
CD4 T Cell Abs: 756 /uL (ref 400–1790)

## 2024-02-04 LAB — URINE CYTOLOGY ANCILLARY ONLY
Chlamydia: NEGATIVE
Comment: NEGATIVE
Comment: NORMAL
Neisseria Gonorrhea: NEGATIVE

## 2024-02-05 LAB — HIV-1 RNA QUANT-NO REFLEX-BLD
HIV 1 RNA Quant: NOT DETECTED {copies}/mL
HIV-1 RNA Quant, Log: NOT DETECTED {Log_copies}/mL

## 2024-02-05 LAB — RPR: RPR Ser Ql: NONREACTIVE

## 2024-02-08 ENCOUNTER — Other Ambulatory Visit: Payer: Self-pay | Admitting: Internal Medicine

## 2024-02-08 DIAGNOSIS — B20 Human immunodeficiency virus [HIV] disease: Secondary | ICD-10-CM

## 2024-02-15 ENCOUNTER — Encounter: Payer: Self-pay | Admitting: Infectious Disease

## 2024-02-15 DIAGNOSIS — R85612 Low grade squamous intraepithelial lesion on cytologic smear of anus (LGSIL): Secondary | ICD-10-CM | POA: Insufficient documentation

## 2024-02-15 NOTE — Progress Notes (Unsigned)
 Subjective:  Chief complaint: follow-up for HIV disease on medications   Patient ID: Chase Rollins, male    DOB: 10-02-1980, 43 y.o.   MRN: [id number]  HPI  Past Medical History:  Diagnosis Date   Anemia associated with chronic renal failure    Chronic hepatitis C (HCC)    transplant from HCV NAT positive donor, per patient Hep C was treated and cleared   Chronic kidney disease    Stage IV,  [facility] follows , no problems since transplant per pt   HIV (human immunodeficiency virus infection) (HCC)    ID-- dr comer   HTN (hypertension)    Follows w/ PCP, Dr. Thedora and nephrologist @ [facility].   Immunosuppression    LGSIL Pap smear of anus    Non-proliferative diabetic retinopathy, severe, both eyes (HCC)    Pneumonia 2017   Research subject    Status post simultaneous kidney and pancreas transplant (HCC) 03/04/2021   @[facility]    Type 1 diabetes mellitus (HCC)    Type I - per Endocrinologist , no meds, pt had pancreas transplant    Past Surgical History:  Procedure Laterality Date   ABDOMINAL EXPLORATION SURGERY  03/07/2021   @ [facility];   ABDOMINAL WASH OUT FOR POSTOPERATIVE BLEEDING   AV FISTULA PLACEMENT Left 07/12/2020   Procedure: LEFT ARM ARTERIOVENOUS (AV) BRACHIOCEPHALIC FISTULA CREATION;  Surgeon: Magda Debby SAILOR, MD;  Location: [facility] OR;  Service: Vascular;  Laterality: Left;   COMBINED KIDNEY-PANCREAS TRANSPLANT Bilateral 03/04/2021   @ [facility]   HIGH RESOLUTION ANOSCOPY N/A 07/02/2023   Procedure: HIGH RESOLUTION ANOSCOPY,  BIOPSY and ABLATION;  Surgeon: Debby Hila, MD;  Location: [facility];  Service: General;  Laterality: N/A;   WISDOM TOOTH EXTRACTION      Family History  Problem Relation Age of Onset   Diabetes Mother    Hypertension Mother    Hypertension Father    Cancer Maternal Uncle    Heart Problems Paternal Aunt    Hypertension Maternal Grandfather    Kidney disease Maternal Grandfather    Diabetes Paternal Grandmother        Social History   Socioeconomic History   Marital status: Married    Spouse name: Selinda   Number of children: 0   Years of education: Not on file   Highest education level: Bachelor's degree (e.g., BA, AB, BS)  Occupational History   Occupation: International aid/development worker    Comment: Engineer, civil (consulting)  Tobacco Use   Smoking status: Never   Smokeless tobacco: Never  Vaping Use   Vaping status: Never Used  Substance and Sexual Activity   Alcohol use: Yes    Alcohol/week: 2.0 standard drinks of alcohol    Types: 2 Glasses of wine per week    Comment: socially   Drug use: No   Sexual activity: Yes    Partners: Male    Comment: declined condoms  Other Topics Concern   Not on file  Social History Narrative   Not on file   Social Drivers of Health   Financial Resource Strain: Low Risk  (07/22/2023)   Overall Financial Resource Strain (CARDIA)    Difficulty of Paying Living Expenses: Not very hard  Food Insecurity: No Food Insecurity (07/22/2023)   Hunger Vital Sign    Worried About Running Out of Food in the Last Year: Never true    Ran Out of Food in the Last Year: Never true  Transportation Needs: No Transportation Needs (07/22/2023)  PRAPARE - Administrator, Civil Service (Medical): No    Lack of Transportation (Non-Medical): No  Physical Activity: Insufficiently Active (07/22/2023)   Exercise Vital Sign    Days of Exercise per Week: 4 days    Minutes of Exercise per Session: 20 min  Stress: No Stress Concern Present (07/22/2023)   [employer] of Occupational Health - Occupational Stress Questionnaire    Feeling of Stress : Not at all  Social Connections: Moderately Isolated (07/22/2023)   Social Connection and Isolation Panel    Frequency of Communication with Friends and Family: Twice a week    Frequency of Social Gatherings with Friends and Family: Twice a week    Attends Religious Services: Never    Database administrator or Organizations: No    Attends  Engineer, structural: Not on file    Marital Status: Married    No Known Allergies   Current Outpatient Medications:    acetaminophen  (TYLENOL ) 500 MG tablet, Take 1,000 mg by mouth every 6 (six) hours as needed., Disp: , Rfl:    aspirin EC 81 MG tablet, Take by mouth., Disp: , Rfl:    benzonatate  (TESSALON  PERLES) 100 MG capsule, Take 1 capsule (100 mg total) by mouth 3 (three) times daily as needed., Disp: 20 capsule, Rfl: 0   BIKTARVY  50-200-25 MG TABS tablet, TAKE 1 TABLET BY MOUTH 1 TIME A DAY, Disp: 90 tablet, Rfl: 0   carvedilol  (COREG ) 12.5 MG tablet, Take 12.5 mg by mouth 2 (two) times daily with a meal., Disp: , Rfl:    mycophenolate (CELLCEPT) 250 MG capsule, TAKE 2 CAPSULES (500 MG TOTAL) BY MOUTH EVERY 12 (TWELVE) HOURS FOR 360 DAYS, Disp: , Rfl:    predniSONE (DELTASONE) 5 MG tablet, Take 5 mg by mouth daily., Disp: , Rfl:    tacrolimus (PROGRAF) 1 MG capsule, SMARTSIG:5 Capsule(s) By Mouth Every 12 Hours, Disp: , Rfl:    Review of Systems     Objective:   Physical Exam        Assessment & Plan:

## 2024-02-16 ENCOUNTER — Other Ambulatory Visit: Payer: Self-pay

## 2024-02-16 ENCOUNTER — Ambulatory Visit: Admitting: Infectious Disease

## 2024-02-16 VITALS — BP 147/85 | HR 78 | Temp 98.4°F | Ht 71.0 in | Wt 174.0 lb

## 2024-02-16 DIAGNOSIS — R85612 Low grade squamous intraepithelial lesion on cytologic smear of anus (LGSIL): Secondary | ICD-10-CM

## 2024-02-16 DIAGNOSIS — E785 Hyperlipidemia, unspecified: Secondary | ICD-10-CM

## 2024-02-16 DIAGNOSIS — I10 Essential (primary) hypertension: Secondary | ICD-10-CM

## 2024-02-16 DIAGNOSIS — Z940 Kidney transplant status: Secondary | ICD-10-CM

## 2024-02-16 DIAGNOSIS — B20 Human immunodeficiency virus [HIV] disease: Secondary | ICD-10-CM | POA: Diagnosis not present

## 2024-02-16 DIAGNOSIS — Z7185 Encounter for immunization safety counseling: Secondary | ICD-10-CM

## 2024-02-16 DIAGNOSIS — E1022 Type 1 diabetes mellitus with diabetic chronic kidney disease: Secondary | ICD-10-CM

## 2024-02-16 DIAGNOSIS — Z113 Encounter for screening for infections with a predominantly sexual mode of transmission: Secondary | ICD-10-CM

## 2024-02-16 DIAGNOSIS — Z9483 Pancreas transplant status: Secondary | ICD-10-CM

## 2024-02-16 DIAGNOSIS — N185 Chronic kidney disease, stage 5: Secondary | ICD-10-CM

## 2024-02-16 MED ORDER — BIKTARVY 50-200-25 MG PO TABS: ORAL_TABLET | ORAL | 3 refills | Status: AC

## 2024-02-16 MED ORDER — ATORVASTATIN CALCIUM 20 MG PO TABS: 20.0000 mg | ORAL_TABLET | Freq: Every day | ORAL | 11 refills | Status: AC

## 2024-06-20 ENCOUNTER — Other Ambulatory Visit

## 2024-07-05 ENCOUNTER — Encounter: Payer: Self-pay | Admitting: Infectious Disease
